# Patient Record
Sex: Female | Born: 1960 | Hispanic: No | Marital: Married | Smoking: Never smoker
Health system: Southern US, Community
[De-identification: ages and names within clinical notes are randomized; demographics above are authoritative.]

## PROBLEM LIST (undated history)

## (undated) DIAGNOSIS — I10 Essential (primary) hypertension: Secondary | ICD-10-CM

## (undated) DIAGNOSIS — N926 Irregular menstruation, unspecified: Secondary | ICD-10-CM

## (undated) DIAGNOSIS — I499 Cardiac arrhythmia, unspecified: Secondary | ICD-10-CM

## (undated) DIAGNOSIS — M25559 Pain in unspecified hip: Secondary | ICD-10-CM

## (undated) DIAGNOSIS — A630 Anogenital (venereal) warts: Secondary | ICD-10-CM

## (undated) DIAGNOSIS — K219 Gastro-esophageal reflux disease without esophagitis: Secondary | ICD-10-CM

## (undated) DIAGNOSIS — Z8742 Personal history of other diseases of the female genital tract: Secondary | ICD-10-CM

## (undated) HISTORY — PX: APPENDECTOMY: SHX54

## (undated) HISTORY — PX: TONSILLECTOMY: SUR1361

## (undated) HISTORY — DX: Personal history of other diseases of the female genital tract: Z87.42

## (undated) HISTORY — DX: Anogenital (venereal) warts: A63.0

## (undated) HISTORY — DX: Pain in unspecified hip: M25.559

## (undated) HISTORY — DX: Irregular menstruation, unspecified: N92.6

## (undated) HISTORY — DX: Essential (primary) hypertension: I10

---

## 2002-09-11 ENCOUNTER — Encounter: Admission: RE | Admit: 2002-09-11 | Discharge: 2002-09-11 | Payer: Self-pay | Admitting: General Practice

## 2002-09-11 ENCOUNTER — Encounter: Payer: Self-pay | Admitting: General Practice

## 2002-09-19 ENCOUNTER — Encounter: Payer: Self-pay | Admitting: General Practice

## 2002-09-19 ENCOUNTER — Encounter: Admission: RE | Admit: 2002-09-19 | Discharge: 2002-09-19 | Payer: Self-pay | Admitting: General Practice

## 2003-07-05 DIAGNOSIS — Z8742 Personal history of other diseases of the female genital tract: Secondary | ICD-10-CM

## 2003-07-05 HISTORY — DX: Personal history of other diseases of the female genital tract: Z87.42

## 2004-02-17 ENCOUNTER — Other Ambulatory Visit: Admission: RE | Admit: 2004-02-17 | Discharge: 2004-02-17 | Payer: Self-pay | Admitting: Obstetrics and Gynecology

## 2004-07-04 DIAGNOSIS — N926 Irregular menstruation, unspecified: Secondary | ICD-10-CM

## 2004-07-04 HISTORY — DX: Irregular menstruation, unspecified: N92.6

## 2005-03-04 DIAGNOSIS — A630 Anogenital (venereal) warts: Secondary | ICD-10-CM

## 2005-03-04 HISTORY — DX: Anogenital (venereal) warts: A63.0

## 2005-03-11 ENCOUNTER — Other Ambulatory Visit: Admission: RE | Admit: 2005-03-11 | Discharge: 2005-03-11 | Payer: Self-pay | Admitting: Obstetrics and Gynecology

## 2006-07-04 DIAGNOSIS — M25559 Pain in unspecified hip: Secondary | ICD-10-CM

## 2006-07-04 HISTORY — DX: Pain in unspecified hip: M25.559

## 2008-07-04 DIAGNOSIS — I499 Cardiac arrhythmia, unspecified: Secondary | ICD-10-CM

## 2008-07-04 HISTORY — PX: OTHER SURGICAL HISTORY: SHX169

## 2008-07-04 HISTORY — DX: Cardiac arrhythmia, unspecified: I49.9

## 2008-12-06 ENCOUNTER — Emergency Department (HOSPITAL_COMMUNITY): Admission: EM | Admit: 2008-12-06 | Discharge: 2008-12-06 | Payer: Self-pay | Admitting: Emergency Medicine

## 2010-05-21 ENCOUNTER — Ambulatory Visit: Payer: Self-pay | Admitting: Diagnostic Radiology

## 2010-05-21 ENCOUNTER — Ambulatory Visit (HOSPITAL_BASED_OUTPATIENT_CLINIC_OR_DEPARTMENT_OTHER): Admission: RE | Admit: 2010-05-21 | Discharge: 2010-05-21 | Payer: Self-pay | Admitting: Internal Medicine

## 2010-06-15 ENCOUNTER — Encounter
Admission: RE | Admit: 2010-06-15 | Discharge: 2010-06-15 | Payer: Self-pay | Source: Home / Self Care | Attending: Gastroenterology | Admitting: Gastroenterology

## 2010-07-04 HISTORY — PX: SHOULDER SURGERY: SHX246

## 2010-07-24 ENCOUNTER — Encounter: Payer: Self-pay | Admitting: Obstetrics and Gynecology

## 2010-08-06 ENCOUNTER — Other Ambulatory Visit: Payer: Self-pay | Admitting: Gastroenterology

## 2010-08-10 ENCOUNTER — Other Ambulatory Visit: Payer: Self-pay

## 2010-08-10 ENCOUNTER — Ambulatory Visit (HOSPITAL_COMMUNITY)
Admission: RE | Admit: 2010-08-10 | Discharge: 2010-08-10 | Disposition: A | Discharge status: Home / Self Care | Payer: Commercial Managed Care - PPO | Source: Ambulatory Visit | Attending: Gastroenterology | Admitting: Gastroenterology

## 2010-08-10 DIAGNOSIS — I10 Essential (primary) hypertension: Secondary | ICD-10-CM | POA: Insufficient documentation

## 2010-08-10 DIAGNOSIS — Z9861 Coronary angioplasty status: Secondary | ICD-10-CM | POA: Insufficient documentation

## 2010-08-10 DIAGNOSIS — R109 Unspecified abdominal pain: Secondary | ICD-10-CM | POA: Insufficient documentation

## 2010-08-10 DIAGNOSIS — K838 Other specified diseases of biliary tract: Secondary | ICD-10-CM | POA: Insufficient documentation

## 2010-08-10 DIAGNOSIS — R110 Nausea: Secondary | ICD-10-CM | POA: Insufficient documentation

## 2010-08-10 DIAGNOSIS — I2510 Atherosclerotic heart disease of native coronary artery without angina pectoris: Secondary | ICD-10-CM | POA: Insufficient documentation

## 2010-10-11 LAB — DIFFERENTIAL
Basophils Absolute: 0 10*3/uL (ref 0.0–0.1)
Basophils Relative: 0 % (ref 0–1)
Neutro Abs: 4.2 10*3/uL (ref 1.7–7.7)
Neutrophils Relative %: 59 % (ref 43–77)

## 2010-10-11 LAB — BASIC METABOLIC PANEL
CO2: 30 mEq/L (ref 19–32)
Calcium: 9.7 mg/dL (ref 8.4–10.5)
Creatinine, Ser: 0.55 mg/dL (ref 0.4–1.2)
Glucose, Bld: 107 mg/dL — ABNORMAL HIGH (ref 70–99)

## 2010-10-11 LAB — CBC
MCHC: 34.4 g/dL (ref 30.0–36.0)
RDW: 13.9 % (ref 11.5–15.5)

## 2010-10-11 LAB — PROTIME-INR: INR: 1.2 (ref 0.00–1.49)

## 2010-10-11 LAB — APTT: aPTT: 28 seconds (ref 24–37)

## 2011-11-16 ENCOUNTER — Telehealth: Payer: Self-pay | Admitting: Obstetrics and Gynecology

## 2011-12-06 ENCOUNTER — Ambulatory Visit (INDEPENDENT_AMBULATORY_CARE_PROVIDER_SITE_OTHER): Payer: Commercial Indemnity | Admitting: Obstetrics and Gynecology

## 2011-12-06 ENCOUNTER — Encounter: Payer: Self-pay | Admitting: Obstetrics and Gynecology

## 2011-12-06 VITALS — BP 128/80 | HR 76 | Ht 69.0 in | Wt 150.0 lb

## 2011-12-06 DIAGNOSIS — Z01419 Encounter for gynecological examination (general) (routine) without abnormal findings: Secondary | ICD-10-CM

## 2011-12-06 DIAGNOSIS — Z124 Encounter for screening for malignant neoplasm of cervix: Secondary | ICD-10-CM

## 2011-12-06 NOTE — Progress Notes (Signed)
Last Pap: 04/27/07 WNL: Yes Regular Periods:no Contraception: none  Monthly Breast exam:yes Tetanus<52yrs:no Nl.Bladder Function: yes  Daily BMs:no Healthy Diet:yes Calcium:no Mammogram:yes 10/2011 per pt Exercise:yes Seatbelt: yes Abuse at home: no Stressful work:yes Sigmoid-colonoscopy: 09/2010 per pt Bone Density: No Pt has questions about multivitamins for women. Pt also states she has difficulty with bowel movements.  Dawn Harrington, Charetha Pt without c/o BP 128/80  Pulse 76  Ht 5\' 9"  (1.753 m)  Wt 150 lb (68.04 kg)  BMI 22.15 kg/m2 Physical Examination: Neck - supple, no significant adenopathy Chest - clear to auscultation, no wheezes, rales or rhonchi, symmetric air entry Heart - normal rate, regular rhythm, normal S1, S2, no murmurs, rubs, clicks or gallops Abdomen - soft, nontender, nondistended, no masses or organomegaly Breasts - breasts appear normal, no suspicious masses, no skin or nipple changes or axillary nodes Pelvic - normal external genitalia, vulva, vagina, cervix, uterus and adnexa, atrophic Rectal - normal rectal, no masses Musculoskeletal - no joint tenderness, deformity or swelling Extremities - peripheral pulses normal, no pedal edema, no clubbing or cyanosis Skin - normal coloration and turgor, no rashes, no suspicious skin lesions noted Normal AEX Pt due for mammogram no colonoscopy due no Pap done yes RT one month Diet and exercise discussed Pt with h/o endometrial polyp no further bleeding.  SHG/US poss EMB at next visit

## 2011-12-08 LAB — PAP IG W/ RFLX HPV ASCU

## 2012-01-09 ENCOUNTER — Other Ambulatory Visit: Payer: Self-pay | Admitting: Obstetrics and Gynecology

## 2012-01-09 ENCOUNTER — Ambulatory Visit (INDEPENDENT_AMBULATORY_CARE_PROVIDER_SITE_OTHER): Payer: Commercial Indemnity

## 2012-01-09 ENCOUNTER — Encounter: Payer: Self-pay | Admitting: Obstetrics and Gynecology

## 2012-01-09 ENCOUNTER — Ambulatory Visit (INDEPENDENT_AMBULATORY_CARE_PROVIDER_SITE_OTHER): Payer: Commercial Indemnity | Admitting: Obstetrics and Gynecology

## 2012-01-09 VITALS — BP 102/60 | Temp 98.5°F | Resp 16 | Ht 69.0 in | Wt 151.0 lb

## 2012-01-09 DIAGNOSIS — N840 Polyp of corpus uteri: Secondary | ICD-10-CM

## 2012-01-09 DIAGNOSIS — Z124 Encounter for screening for malignant neoplasm of cervix: Secondary | ICD-10-CM

## 2012-01-09 DIAGNOSIS — Z3201 Encounter for pregnancy test, result positive: Secondary | ICD-10-CM

## 2012-01-09 NOTE — Progress Notes (Signed)
Sonohysterography with endometrial bx today .ccoSHG/EMBX:  The patient was consented for both procedures.  She was placed in dorsal lithotomy position and speculum placed in the vagina.  The cervix was cleansed with three betadine swabs.  The endometrial pipet was placed in the the endometrial cavity through the cervix.  The uterus did sound to 7cm.  The pipet was removed and specimen was sent to pathology.  The sonohysterography catheter was then placed through the cervix and vaginal probe placed back in the vagina.   [place] 5.48 by 3.21 cm.  Normal adnexa and ovaries.  On SHG there is a .35 cm polyp Plan hysteroscopy and polypectomy

## 2012-01-16 ENCOUNTER — Telehealth: Payer: Self-pay | Admitting: Obstetrics and Gynecology

## 2012-01-19 ENCOUNTER — Telehealth: Payer: Self-pay

## 2012-01-19 ENCOUNTER — Telehealth: Payer: Self-pay | Admitting: Obstetrics and Gynecology

## 2012-01-19 NOTE — Telephone Encounter (Signed)
D&C Hysteroscopy; Polypectomy with TruClear.  [employer] effective 04/04/11. Plan pays 80/20 after a $550 deductible. Pre-op due $80.  -Adrianne Pridgen

## 2012-01-19 NOTE — Telephone Encounter (Signed)
Lm on vm tcb rgd labs 

## 2012-01-19 NOTE — Telephone Encounter (Signed)
Message copied by Rolla Plate on Thu Jan 19, 2012  4:25 PM ------      Message from: Jaymes Graff      Created: Thu Jan 19, 2012  3:48 PM       Please call the patient and let her know her endometrial biopsy is normal

## 2012-01-23 ENCOUNTER — Telehealth: Payer: Self-pay | Admitting: Obstetrics and Gynecology

## 2012-01-23 NOTE — Telephone Encounter (Signed)
D&C Hysteroscopy; Polypectomy with TruClear scheduled for 02/07/12 @ 10:45 with ND.  [employer] effective 04/04/11. Plan pays 80/20 after a $550 deductible. Pre-op due $80.  -Adrianne Pridgen

## 2012-01-23 NOTE — Telephone Encounter (Signed)
Spoke with pt informed endo biopsy wnl pt voice understanding 

## 2012-01-23 NOTE — Telephone Encounter (Signed)
Lm with man for pt to call office

## 2012-01-31 ENCOUNTER — Encounter: Payer: Self-pay | Admitting: Obstetrics and Gynecology

## 2012-01-31 ENCOUNTER — Ambulatory Visit (INDEPENDENT_AMBULATORY_CARE_PROVIDER_SITE_OTHER): Payer: Commercial Indemnity | Admitting: Obstetrics and Gynecology

## 2012-01-31 VITALS — BP 112/78 | HR 80 | Temp 98.3°F | Ht 67.5 in | Wt 151.0 lb

## 2012-01-31 DIAGNOSIS — Z01818 Encounter for other preprocedural examination: Secondary | ICD-10-CM

## 2012-01-31 NOTE — Progress Notes (Signed)
Dawn Harrington is a 51 y.o. female [id number] who presents for hysteroscopy with dilatation, curettage and polypectomy because of an endometrial polyp.  In 2008 patient was found to have an endometrial polyp, during an evaluation for menorrhagia.  She returned to [facility] in June of this year without having followed up on that  polyp.  An endometrial biopsy performed at that time  showed benign findings without endometritis, hyperplasia or malignancy, it was further reported that atrophy was favored.  A pelvic ultrasound with sono-hysterogram revealed uterus 5.48 x 3.21 x 3.0 cm, normal appearing ovaries and an endometrial polyp measuring 0.35 x 0.34 cm.  Patient reports approximately 2-3 menstrual period each year, that are virtually painless.  Her flow  will last 3 days and only require a pad change 4 times a day.  Given the findings on her  recent ultrasound and previous history, the patient has decided to proceed with hysteroscopic polypectomy with dilatation and curettage.  [The use of [employer]' phone line was provided for this patient's preoperative visit however she declined  its use and wishes to utilize her adult daughter-[name], who has accompanied her.]  Past Medical History  OB History: G7P0016 SVD  GYN History: menarche 51 YO;  LMP -see HPI;   Contracepton-no method; History of  genital wart.;  Denies history of abnormal PAP smears;  Last PAP smear June 2013- normal  Medical History: hypertension,  genital warts, cardiac catheterization years ago (per patient, was told she had a small blockage but no heart damage)  Surgical History:    2012-Shoulder surgery and 1980 Tonsillectomy  Denies problems with anesthesia or history of blood transfusions  Family History: negative  Social History:  Married, 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   Outpatient Encounter Prescriptions as of 01/31/2012  Medication Sig Dispense Refill  . aspirin 81 MG tablet Take 81 mg by mouth daily.      . Cholecalciferol (VITAMIN D PO) Take by mouth.      . UNKNOWN TO PATIENT PT TAKE A BP MED; DO NOT KNOW NAME      . cyclobenzaprine (FLEXERIL) 5 MG tablet Take 5 mg by mouth 3 (three) times daily as needed.      . gabapentin (NEURONTIN) 100 MG capsule Take 100 mg by mouth 3 (three) times daily.        No Known Allergies Denies sensitivity to peanuts, shellfish, soy, latex, or  adhesives  ROS: Admits to glasses, partial dental plate in lower jaw;   Denies headache, vision changes, dysphagia, tinnitus, dizziness,  chest pain, shortness of breath, nausea, vomiting, diarrhea, dysuria, hematuria, pelvic pain, swelling of joints,easy bruising,  myalgias, arthralgias, skin rashes and except as is mentioned in the history of present illness, patient's review of systems is otherwise negative   Physical Exam    BP 112/78  Pulse 80  Temp 98.3 F (36.8 C) (Oral)  Ht 5' 7.5" (1.715 m)  Wt 151 lb (68.493 kg)  BMI 23.30 kg/m2  Neck: supple without masses or thyromegaly Lungs: clear to auscultation Heart: regular rate and rhythm Abdomen: soft, non-tender and no organomegaly Pelvic:EGBUS- wnl except female circumcision; vagina-atrophic; uterus-normal size without tenderdess, cervix without lesions or motion tenderness; adnexae-no tenderness or   masses Extremities:  no clubbing, cyanosis or edema   Assesment:  Endometrial Polyp                        Partial Language Barrier   Disposition:  A discussion was held with patient regarding the indication for her procedure(s) along with the risks, which include but are not limited to: reaction to anesthesia, damage to adjacent organs, infection, scarring  and excessive bleeding.  Patient verbalized understanding of these risks, had her questions answered and has consented to proceed with Hysteroscopy,  Dilatation, Curettage, and  Polypectomy  with Tru Clear at [facility]  02/07/12 @ 10:45 a.m.   CSN# [id number]   Kataryna Mcquilkin J. Emilea Goga, PA-C  for Dr. Naima Dillard   

## 2012-01-31 NOTE — H&P (Signed)
Dawn Harrington is a 51 y.o. female [id number] who presents for hysteroscopy with dilatation, curettage and polypectomy because of an endometrial polyp.  In 2008 patient was found to have an endometrial polyp, during an evaluation for menorrhagia.  She returned to [facility] in June of this year without having followed up on that  polyp.  An endometrial biopsy performed at that time  showed benign findings without endometritis, hyperplasia or malignancy, it was further reported that atrophy was favored.  A pelvic ultrasound with sono-hysterogram revealed uterus 5.48 x 3.21 x 3.0 cm, normal appearing ovaries and an endometrial polyp measuring 0.35 x 0.34 cm.  Patient reports approximately 2-3 menstrual period each year, that are virtually painless.  Her flow  will last 3 days and only require a pad change 4 times a day.  Given the findings on her  recent ultrasound and previous history, the patient has decided to proceed with hysteroscopic polypectomy with dilatation and curettage.  [The use of [employer]' phone line was provided for this patient's preoperative visit however she declined  its use and wishes to utilize her adult daughter-Dawn Harrington, who has accompanied her.]  Past Medical History  OB History: G7P0016 SVD  GYN History: menarche 51 YO;  LMP -see HPI;   Contracepton-no method; History of  genital wart.;  Denies history of abnormal PAP smears;  Last PAP smear June 2013- normal  Medical History: hypertension,  genital warts, cardiac catheterization years ago (per patient, was told she had a small blockage but no heart damage)  Surgical History:    2012-Shoulder surgery and 1980 Tonsillectomy  Denies problems with anesthesia or history of blood transfusions  Family History: negative  Social History:  Married, employed in housekeeping at [employer];  [place] Arabic, [place] and  limited [place];  Denies alcohol,                            tobacco or illicit  drug use.   Outpatient Encounter Prescriptions as of 01/31/2012  Medication Sig Dispense Refill  . aspirin 81 MG tablet Take 81 mg by mouth daily.      . Cholecalciferol (VITAMIN D PO) Take by mouth.      Marland Kitchen UNKNOWN TO PATIENT PT TAKE A BP MED; DO NOT KNOW NAME      . cyclobenzaprine (FLEXERIL) 5 MG tablet Take 5 mg by mouth 3 (three) times daily as needed.      . gabapentin (NEURONTIN) 100 MG capsule Take 100 mg by mouth 3 (three) times daily.        No Known Allergies Denies sensitivity to peanuts, shellfish, soy, latex, or  adhesives  ROS: Admits to glasses, partial dental plate in lower jaw;   Denies headache, vision changes, dysphagia, tinnitus, dizziness,  chest pain, shortness of breath, nausea, vomiting, diarrhea, dysuria, hematuria, pelvic pain, swelling of joints,easy bruising,  myalgias, arthralgias, skin rashes and except as is mentioned in the history of present illness, patient's review of systems is otherwise negative   Physical Exam    BP 112/78  Pulse 80  Temp 98.3 F (36.8 C) (Oral)  Ht 5' 7.5" (1.715 m)  Wt 151 lb (68.493 kg)  BMI 23.30 kg/m2  Neck: supple without masses or thyromegaly Lungs: clear to auscultation Heart: regular rate and rhythm Abdomen: soft, non-tender and no organomegaly Pelvic:EGBUS- wnl except female circumcision; vagina-atrophic; uterus-normal size without tenderdess, cervix without lesions or motion tenderness; adnexae-no tenderness or  masses Extremities:  no clubbing, cyanosis or edema   Assesment:  Endometrial Polyp                        Partial Language Barrier   Disposition:  A discussion was held with patient regarding the indication for her procedure(s) along with the risks, which include but are not limited to: reaction to anesthesia, damage to adjacent organs, infection, scarring  and excessive bleeding.  Patient verbalized understanding of these risks, had her questions answered and has consented to proceed with Hysteroscopy,  Dilatation, Curettage, and  Polypectomy  with Tru Clear at [facility] 02/07/12 @ 10:45 a.m.   CSN# [id number]   Rylea Selway J. Lowell Guitar, PA-C  for Dr. Jaymes Graff

## 2012-02-01 ENCOUNTER — Encounter (HOSPITAL_COMMUNITY): Payer: Self-pay | Admitting: Pharmacist

## 2012-02-01 NOTE — Evaluation (Signed)
Patient speaks limited English (African), impression was she had heart surgery in [place] at some time, unable to understand name of cardiologist in [facility] and son does not know the name. Will request info from [facility]

## 2012-02-02 ENCOUNTER — Encounter (HOSPITAL_COMMUNITY)
Admission: RE | Admit: 2012-02-02 | Discharge: 2012-02-02 | Disposition: A | Discharge status: Home / Self Care | Payer: Managed Care, Other (non HMO) | Source: Ambulatory Visit | Attending: Obstetrics and Gynecology | Admitting: Obstetrics and Gynecology

## 2012-02-02 ENCOUNTER — Encounter (HOSPITAL_COMMUNITY): Payer: Self-pay

## 2012-02-02 HISTORY — DX: Cardiac arrhythmia, unspecified: I49.9

## 2012-02-02 HISTORY — DX: Gastro-esophageal reflux disease without esophagitis: K21.9

## 2012-02-02 LAB — CBC
MCV: 90.8 fL (ref 78.0–100.0)
Platelets: 250 10*3/uL (ref 150–400)
RBC: 4.37 MIL/uL (ref 3.87–5.11)
WBC: 5.3 10*3/uL (ref 4.0–10.5)

## 2012-02-02 NOTE — Patient Instructions (Addendum)
   Your procedure is scheduled on: Tuesday August 6th  Enter through the Main Entrance of [facility] at: 9:15am Pick up the phone at the desk and dial [phone] and inform us of your arrival.  Please call this number if you have any problems the morning of surgery: [phone]  Remember: Do not eat food after midnight: Monday Do not drink clear liquids after: midnight Monday Take these medicines the morning of surgery with a SIP OF WATER: Norvasc  Do not wear jewelry, make-up, or FINGER nail polish No metal in your hair or on your body. Do not wear lotions, powders, perfumes or deodorant. Do not shave 48 hours prior to surgery. Do not bring valuables to the hospital. Contacts, dentures or bridgework may not be worn into surgery.    Patients discharged on the day of surgery will not be allowed to drive home.     Remember to use your hibiclens as instructed.Please shower with 1/2 bottle the evening before your surgery and the other 1/2 bottle the morning of surgery. Neck down avoiding private area.

## 2012-02-02 NOTE — Pre-Procedure Instructions (Addendum)
Interpreter here Dawn Harrington from Dawn Harrington Requested EKG from Dr Sullivan Lone office [facility] in [place]- Pt states had stress test at [facility] in 2011 or 2012-requested. Reviewed history with Dr Randall An spray on call to OR Call back received from [facility] Records-no records for stress test.

## 2012-02-06 MED ORDER — NITROGLYCERIN 0.4 MG/SPRAY TL SOLN
1.0000 | Status: DC | PRN
  Filled 2012-02-06: qty 4.9

## 2012-02-07 ENCOUNTER — Ambulatory Visit (HOSPITAL_COMMUNITY): Payer: Managed Care, Other (non HMO) | Admitting: Anesthesiology

## 2012-02-07 ENCOUNTER — Encounter (HOSPITAL_COMMUNITY): Payer: Self-pay | Admitting: Anesthesiology

## 2012-02-07 ENCOUNTER — Encounter (HOSPITAL_COMMUNITY)
Admission: RE | Disposition: A | Discharge status: Home / Self Care | Payer: Self-pay | Source: Ambulatory Visit | Attending: Obstetrics and Gynecology

## 2012-02-07 ENCOUNTER — Ambulatory Visit (HOSPITAL_COMMUNITY)
Admission: RE | Admit: 2012-02-07 | Discharge: 2012-02-07 | Disposition: A | Discharge status: Home / Self Care | Payer: Managed Care, Other (non HMO) | Source: Ambulatory Visit | Attending: Obstetrics and Gynecology | Admitting: Obstetrics and Gynecology

## 2012-02-07 DIAGNOSIS — Z9889 Other specified postprocedural states: Secondary | ICD-10-CM

## 2012-02-07 DIAGNOSIS — N938 Other specified abnormal uterine and vaginal bleeding: Secondary | ICD-10-CM | POA: Insufficient documentation

## 2012-02-07 DIAGNOSIS — Z01812 Encounter for preprocedural laboratory examination: Secondary | ICD-10-CM | POA: Insufficient documentation

## 2012-02-07 DIAGNOSIS — N949 Unspecified condition associated with female genital organs and menstrual cycle: Secondary | ICD-10-CM | POA: Insufficient documentation

## 2012-02-07 DIAGNOSIS — N950 Postmenopausal bleeding: Secondary | ICD-10-CM

## 2012-02-07 DIAGNOSIS — Z01818 Encounter for other preprocedural examination: Secondary | ICD-10-CM | POA: Insufficient documentation

## 2012-02-07 SURGERY — DILATATION & CURETTAGE/HYSTEROSCOPY WITH RESECTOCOPE
Anesthesia: General | Site: Vagina | Wound class: Clean Contaminated

## 2012-02-07 MED ORDER — DEXAMETHASONE SODIUM PHOSPHATE 4 MG/ML IJ SOLN
INTRAMUSCULAR | Status: DC | PRN
  Administered 2012-02-07 (×2): 5 mg via INTRAVENOUS

## 2012-02-07 MED ORDER — EPHEDRINE SULFATE 50 MG/ML IJ SOLN
INTRAMUSCULAR | Status: DC | PRN
  Administered 2012-02-07 (×3): 10 mg via INTRAVENOUS

## 2012-02-07 MED ORDER — DEXAMETHASONE SODIUM PHOSPHATE 10 MG/ML IJ SOLN
INTRAMUSCULAR | Status: AC
  Filled 2012-02-07: qty 1

## 2012-02-07 MED ORDER — KETOROLAC TROMETHAMINE 30 MG/ML IJ SOLN: 15.0000 mg | Freq: Once | INTRAMUSCULAR | Status: DC | PRN

## 2012-02-07 MED ORDER — SODIUM CHLORIDE 0.9 % IR SOLN
Status: DC | PRN
  Administered 2012-02-07: 3000 mL

## 2012-02-07 MED ORDER — SILVER NITRATE-POT NITRATE 75-25 % EX MISC
CUTANEOUS | Status: AC
  Filled 2012-02-07: qty 1

## 2012-02-07 MED ORDER — PROMETHAZINE HCL 25 MG RE SUPP
RECTAL | Status: AC
  Filled 2012-02-07: qty 1

## 2012-02-07 MED ORDER — IBUPROFEN 600 MG PO TABS: 600.0000 mg | ORAL_TABLET | Freq: Four times a day (QID) | ORAL | Status: AC | PRN

## 2012-02-07 MED ORDER — LIDOCAINE HCL (CARDIAC) 20 MG/ML IV SOLN
INTRAVENOUS | Status: AC
  Filled 2012-02-07: qty 5

## 2012-02-07 MED ORDER — MIDAZOLAM HCL 5 MG/5ML IJ SOLN
INTRAMUSCULAR | Status: DC | PRN
  Administered 2012-02-07 (×2): 1 mg via INTRAVENOUS

## 2012-02-07 MED ORDER — FENTANYL CITRATE 0.05 MG/ML IJ SOLN: 25.0000 ug | INTRAMUSCULAR | Status: DC | PRN

## 2012-02-07 MED ORDER — EPHEDRINE 5 MG/ML INJ
INTRAVENOUS | Status: AC
  Filled 2012-02-07: qty 10

## 2012-02-07 MED ORDER — LIDOCAINE HCL (CARDIAC) 20 MG/ML IV SOLN
INTRAVENOUS | Status: DC | PRN
  Administered 2012-02-07: 60 mg via INTRAVENOUS

## 2012-02-07 MED ORDER — PROMETHAZINE HCL 25 MG RE SUPP: 25.0000 mg | RECTAL | Status: DC | PRN

## 2012-02-07 MED ORDER — ONDANSETRON HCL 4 MG/2ML IJ SOLN
INTRAMUSCULAR | Status: AC
  Filled 2012-02-07: qty 2

## 2012-02-07 MED ORDER — KETOROLAC TROMETHAMINE 30 MG/ML IJ SOLN
INTRAMUSCULAR | Status: AC
  Filled 2012-02-07: qty 1

## 2012-02-07 MED ORDER — ONDANSETRON HCL 4 MG/2ML IJ SOLN
INTRAMUSCULAR | Status: DC | PRN
  Administered 2012-02-07: 4 mg via INTRAVENOUS

## 2012-02-07 MED ORDER — FENTANYL CITRATE 0.05 MG/ML IJ SOLN
INTRAMUSCULAR | Status: DC | PRN
  Administered 2012-02-07: 12.5 ug via INTRAVENOUS
  Administered 2012-02-07: 50 ug via INTRAVENOUS
  Administered 2012-02-07: 12.5 ug via INTRAVENOUS

## 2012-02-07 MED ORDER — MIDAZOLAM HCL 2 MG/2ML IJ SOLN
INTRAMUSCULAR | Status: AC
  Filled 2012-02-07: qty 2

## 2012-02-07 MED ORDER — LIDOCAINE HCL 2 % IJ SOLN
INTRAMUSCULAR | Status: DC | PRN
  Administered 2012-02-07: 20 mL

## 2012-02-07 MED ORDER — LIDOCAINE HCL 2 % IJ SOLN
INTRAMUSCULAR | Status: AC
  Filled 2012-02-07: qty 1

## 2012-02-07 MED ORDER — KETOROLAC TROMETHAMINE 30 MG/ML IJ SOLN
INTRAMUSCULAR | Status: DC | PRN
  Administered 2012-02-07: 30 mg via INTRAVENOUS

## 2012-02-07 MED ORDER — ONDANSETRON HCL 4 MG/2ML IJ SOLN: 4.0000 mg | Freq: Once | INTRAMUSCULAR | Status: DC | PRN

## 2012-02-07 MED ORDER — MEPERIDINE HCL 25 MG/ML IJ SOLN: 6.2500 mg | INTRAMUSCULAR | Status: DC | PRN

## 2012-02-07 MED ORDER — PROPOFOL 10 MG/ML IV EMUL
INTRAVENOUS | Status: AC
  Filled 2012-02-07: qty 20

## 2012-02-07 MED ORDER — FENTANYL CITRATE 0.05 MG/ML IJ SOLN
INTRAMUSCULAR | Status: AC
  Filled 2012-02-07: qty 2

## 2012-02-07 MED ORDER — LACTATED RINGERS IV SOLN
INTRAVENOUS | Status: DC
  Administered 2012-02-07 (×2): via INTRAVENOUS

## 2012-02-07 MED ORDER — SILVER NITRATE-POT NITRATE 75-25 % EX MISC
CUTANEOUS | Status: DC | PRN
  Administered 2012-02-07: 1

## 2012-02-07 MED ORDER — PROPOFOL 10 MG/ML IV EMUL
INTRAVENOUS | Status: DC | PRN
  Administered 2012-02-07: 150 mg via INTRAVENOUS

## 2012-02-07 SURGICAL SUPPLY — 15 items
ABLATOR ENDOMETRIAL BIPOLAR (ABLATOR) IMPLANT
CANISTER SUCTION 2500CC (MISCELLANEOUS) ×1 IMPLANT
CATH ROBINSON RED A/P 16FR (CATHETERS) ×2 IMPLANT
CLOTH BEACON ORANGE TIMEOUT ST (SAFETY) ×2 IMPLANT
CONTAINER PREFILL 10% NBF 60ML (FORM) ×2 IMPLANT
ELECT REM PT RETURN 9FT ADLT (ELECTROSURGICAL) ×2
ELECTRODE REM PT RTRN 9FT ADLT (ELECTROSURGICAL) ×1 IMPLANT
GLOVE BIO SURGEON STRL SZ 6.5 (GLOVE) ×2 IMPLANT
GLOVE BIOGEL PI IND STRL 7.0 (GLOVE) ×1 IMPLANT
GLOVE BIOGEL PI INDICATOR 7.0 (GLOVE) ×1
GOWN STRL REIN XL XLG (GOWN DISPOSABLE) ×4 IMPLANT
PACK HYSTEROSCOPY LF (CUSTOM PROCEDURE TRAY) ×2 IMPLANT
SYR 20CC LL (SYRINGE) ×2 IMPLANT
TOWEL OR 17X24 6PK STRL BLUE (TOWEL DISPOSABLE) ×4 IMPLANT
WATER STERILE IRR 1000ML POUR (IV SOLUTION) ×2 IMPLANT

## 2012-02-07 NOTE — Anesthesia Postprocedure Evaluation (Signed)
Anesthesia Post Note  Patient: Dawn Harrington  Procedure(s) Performed: Procedure(s) (LRB): DILATATION & CURETTAGE/HYSTEROSCOPY WITH RESECTOCOPE (N/A)  Anesthesia type: General  Patient location: PACU  Post pain: Pain level controlled  Post assessment: Post-op Vital signs reviewed  Last Vitals:  Filed Vitals:   02/07/12 1130  BP: 104/62  Pulse: 85  Temp: 36.5 C  Resp: 12    Post vital signs: Reviewed  Level of consciousness: sedated  Complications: No apparent anesthesia complicationsfj

## 2012-02-07 NOTE — H&P (View-Only) (Signed)
Dawn Harrington is a 51 y.o. female [id number] who presents for hysteroscopy with dilatation, curettage and polypectomy because of an endometrial polyp.  In 2008 patient was found to have an endometrial polyp, during an evaluation for menorrhagia.  She returned to [facility] in June of this year without having followed up on that  polyp.  An endometrial biopsy performed at that time  showed benign findings without endometritis, hyperplasia or malignancy, it was further reported that atrophy was favored.  A pelvic ultrasound with sono-hysterogram revealed uterus 5.48 x 3.21 x 3.0 cm, normal appearing ovaries and an endometrial polyp measuring 0.35 x 0.34 cm.  Patient reports approximately 2-3 menstrual period each year, that are virtually painless.  Her flow  will last 3 days and only require a pad change 4 times a day.  Given the findings on her  recent ultrasound and previous history, the patient has decided to proceed with hysteroscopic polypectomy with dilatation and curettage.  [The use of [employer]' phone line was provided for this patient's preoperative visit however she declined  its use and wishes to utilize her adult daughter-Dawn Harrington, who has accompanied her.]  Past Medical History  OB History: G7P0016 SVD  GYN History: menarche 51 YO;  LMP -see HPI;   Contracepton-no method; History of  genital wart.;  Denies history of abnormal PAP smears;  Last PAP smear June 2013- normal  Medical History: hypertension,  genital warts, cardiac catheterization years ago (per patient, was told she had a small blockage but no heart damage)  Surgical History:    2012-Shoulder surgery and 1980 Tonsillectomy  Denies problems with anesthesia or history of blood transfusions  Family History: negative  Social History:  Married, 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   Outpatient Encounter Prescriptions as of 01/31/2012  Medication Sig Dispense Refill  . aspirin 81 MG tablet Take 81 mg by mouth daily.      . Cholecalciferol (VITAMIN D PO) Take by mouth.      . UNKNOWN TO PATIENT PT TAKE A BP MED; DO NOT KNOW NAME      . cyclobenzaprine (FLEXERIL) 5 MG tablet Take 5 mg by mouth 3 (three) times daily as needed.      . gabapentin (NEURONTIN) 100 MG capsule Take 100 mg by mouth 3 (three) times daily.        No Known Allergies Denies sensitivity to peanuts, shellfish, soy, latex, or  adhesives  ROS: Admits to glasses, partial dental plate in lower jaw;   Denies headache, vision changes, dysphagia, tinnitus, dizziness,  chest pain, shortness of breath, nausea, vomiting, diarrhea, dysuria, hematuria, pelvic pain, swelling of joints,easy bruising,  myalgias, arthralgias, skin rashes and except as is mentioned in the history of present illness, patient's review of systems is otherwise negative   Physical Exam    BP 112/78  Pulse 80  Temp 98.3 F (36.8 C) (Oral)  Ht 5' 7.5" (1.715 m)  Wt 151 lb (68.493 kg)  BMI 23.30 kg/m2  Neck: supple without masses or thyromegaly Lungs: clear to auscultation Heart: regular rate and rhythm Abdomen: soft, non-tender and no organomegaly Pelvic:EGBUS- wnl except female circumcision; vagina-atrophic; uterus-normal size without tenderdess, cervix without lesions or motion tenderness; adnexae-no tenderness or   masses Extremities:  no clubbing, cyanosis or edema   Assesment:  Endometrial Polyp                        Partial Language Barrier   Disposition:  A discussion was held with patient regarding the indication for her procedure(s) along with the risks, which include but are not limited to: reaction to anesthesia, damage to adjacent organs, infection, scarring  and excessive bleeding.  Patient verbalized understanding of these risks, had her questions answered and has consented to proceed with Hysteroscopy,  Dilatation, Curettage, and  Polypectomy  with Tru Clear at [facility] 02/07/12 @ 10:45 a.m.   CSN# [id number]   Dawn Habeeb J. Jr Milliron, PA-C  for Dr. Naima Harrington   

## 2012-02-07 NOTE — Anesthesia Preprocedure Evaluation (Signed)
Anesthesia Evaluation  Patient identified by MRN, date of birth, ID band Patient awake    Reviewed: Allergy & Precautions, H&P , NPO status , Patient's Chart, lab work & pertinent test results  Airway Mallampati: I TM Distance: >3 FB Neck ROM: full    Dental No notable dental hx. (+) Teeth Intact   Pulmonary neg pulmonary ROS,    Pulmonary exam normal       Cardiovascular hypertension, Pt. on medications     Neuro/Psych negative neurological ROS  negative psych ROS   GI/Hepatic Neg liver ROS,   Endo/Other  negative endocrine ROS  Renal/GU negative Renal ROS  negative genitourinary   Musculoskeletal negative musculoskeletal ROS (+)   Abdominal Normal abdominal exam  (+)   Peds negative pediatric ROS (+)  Hematology negative hematology ROS (+)   Anesthesia Other Findings   Reproductive/Obstetrics negative OB ROS                           Anesthesia Physical Anesthesia Plan  ASA: II  Anesthesia Plan: General   Post-op Pain Management:    Induction: Intravenous  Airway Management Planned: LMA  Additional Equipment:   Intra-op Plan:   Post-operative Plan:   Informed Consent: I have reviewed the patients History and Physical, chart, labs and discussed the procedure including the risks, benefits and alternatives for the proposed anesthesia with the patient or authorized representative who has indicated his/her understanding and acceptance.     Plan Discussed with: CRNA and Surgeon  Anesthesia Plan Comments:         Anesthesia Quick Evaluation

## 2012-02-07 NOTE — Op Note (Signed)
Pre op DX: Endometrial Polyp and PMVB   Post Op QM:VHQI   PHYSICIAN : Magaby Rumberger   ASSISTANTS: none   ANESTHESIA:   MAC and paracervical block  ESTIMATED BLOOD LOSS: minimal  LOCAL MEDICATIONS USED:  LIDOCAINE 20CC  SPECIMEN:  Source of Specimen:  endometrial curettings  DISPOSITION OF SPECIMEN:  PATHOLOGY  COUNTS Correct:  YES    DICTATION #: The patient was taken to the operating room and prepped and draped in a normal sterile fashion. An in out catheter was used to drain the bladder.   A bivalve speculum was placed into the vagina and anterior lip of the cervix was grasped with a single-tooth tenaculum.  20 cc of 2% lidocaine was used for cervical block.  the cervix was then dilated with Shawnie Pons dilators up to 17. The hysteroscope was placed into the uterine cavity. The  entire uterus and both ostia were visualized.   Hyseroscope was then removed from the uterus. A sharp curettage was then done with a curette and endometrial curettings were obtained. The endometrial curettings were sent to pathology. . Both ostia were  visualized there were no polyps or submucosal fibroids or endometrial masses seen. The tenaculum was removed from the cervix and hemostasis was noted.   PLAN OF CARE: discharge to home  PATIENT DISPOSITION:  PACU - hemodynamically stable.

## 2012-02-07 NOTE — Interval H&P Note (Signed)
History and Physical Interval Note:  02/07/2012 10:31 AM  Dawn Harrington  has presented today for surgery, with the diagnosis of Endometrial Polyp  The various methods of treatment have been discussed with the patient and family. After consideration of risks, benefits and other options for treatment, the patient has consented to  Procedure(s) (LRB): DILATATION & CURETTAGE/HYSTEROSCOPY WITH RESECTOCOPE (N/A) as a surgical intervention .  The patient's history has been reviewed, patient examined, no change in status, stable for surgery.  I have reviewed the patient's chart and labs.  Questions were answered to the patient's satisfaction.     [facility]

## 2012-02-07 NOTE — H&P (View-Only) (Signed)
Dawn Harrington is a 51 y.o. female G7P0016 who presents for hysteroscopy with dilatation, curettage and polypectomy because of an endometrial polyp.  In 2008 patient was found to have an endometrial polyp, during an evaluation for menorrhagia.  She returned to [facility] in June of this year without having followed up on that  polyp.  An endometrial biopsy performed at that time  showed benign findings without endometritis, hyperplasia or malignancy, it was further reported that atrophy was favored.  A pelvic ultrasound with sono-hysterogram revealed uterus 5.48 x 3.21 x 3.0 cm, normal appearing ovaries and an endometrial polyp measuring 0.35 x 0.34 cm.  Patient reports approximately 2-3 menstrual period each year, that are virtually painless.  Her flow  will last 3 days and only require a pad change 4 times a day.  Given the findings on her  recent ultrasound and previous history, the patient has decided to proceed with hysteroscopic polypectomy with dilatation and curettage.  [The use of [employer]' phone line was provided for this patient's preoperative visit however she declined  its use and wishes to utilize her adult daughter-[name], who has accompanied her.]  Past Medical History  OB History: G7P0016 SVD  GYN History: menarche 51 YO;  LMP -see HPI;   Contracepton-no method; History of  genital wart.;  Denies history of abnormal PAP smears;  Last PAP smear June 2013- normal  Medical History: hypertension,  genital warts, cardiac catheterization years ago (per patient, was told she had a small blockage but no heart damage)  Surgical History:    2012-Shoulder surgery and 1980 Tonsillectomy  Denies problems with anesthesia or history of blood transfusions  Family History: negative  Social History:  Married, 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   Outpatient Encounter Prescriptions as of 01/31/2012  Medication Sig Dispense Refill  . aspirin 81 MG tablet Take 81 mg by mouth daily.      . Cholecalciferol (VITAMIN D PO) Take by mouth.      . UNKNOWN TO PATIENT PT TAKE A BP MED; DO NOT KNOW NAME      . cyclobenzaprine (FLEXERIL) 5 MG tablet Take 5 mg by mouth 3 (three) times daily as needed.      . gabapentin (NEURONTIN) 100 MG capsule Take 100 mg by mouth 3 (three) times daily.        No Known Allergies Denies sensitivity to peanuts, shellfish, soy, latex, or  adhesives  ROS: Admits to glasses, partial dental plate in lower jaw;   Denies headache, vision changes, dysphagia, tinnitus, dizziness,  chest pain, shortness of breath, nausea, vomiting, diarrhea, dysuria, hematuria, pelvic pain, swelling of joints,easy bruising,  myalgias, arthralgias, skin rashes and except as is mentioned in the history of present illness, patient's review of systems is otherwise negative   Physical Exam    BP 112/78  Pulse 80  Temp 98.3 F (36.8 C) (Oral)  Ht 5' 7.5" (1.715 m)  Wt 151 lb (68.493 kg)  BMI 23.30 kg/m2  Neck: supple without masses or thyromegaly Lungs: clear to auscultation Heart: regular rate and rhythm Abdomen: soft, non-tender and no organomegaly Pelvic:EGBUS- wnl except female circumcision; vagina-atrophic; uterus-normal size without tenderdess, cervix without lesions or motion tenderness; adnexae-no tenderness or   masses Extremities:  no clubbing, cyanosis or edema   Assesment:  Endometrial Polyp                        Partial Language Barrier   Disposition:  A discussion was held with patient regarding the indication for her procedure(s) along with the risks, which include but are not limited to: reaction to anesthesia, damage to adjacent organs, infection, scarring  and excessive bleeding.  Patient verbalized understanding of these risks, had her questions answered and has consented to proceed with Hysteroscopy,  Dilatation, Curettage, and  Polypectomy  with Tru Clear at [facility] 02/07/12 @ 10:45 a.m.   CSN# [id number]   Leven Hoel J. Brianna Bennett, PA-C  for Dr. Naima Dillard   

## 2012-02-07 NOTE — Progress Notes (Signed)
Pt up in phase 2 and nauseated, vomiting noted, Dr Malen Gauze made aware, Phenergan suppository ordered, administer 25mg  pr per order Discharged home with family

## 2012-02-07 NOTE — Interval H&P Note (Signed)
History and Physical Interval Note:  02/07/2012 10:32 AM  Dawn Harrington  has presented today for surgery, with the diagnosis of Endometrial Polyp  The various methods of treatment have been discussed with the patient and family. After consideration of risks, benefits and other options for treatment, the patient has consented to  Procedure(s) (LRB): DILATATION & CURETTAGE/HYSTEROSCOPY WITH RESECTOCOPE (N/A) as a surgical intervention .  The patient's history has been reviewed, patient examined, no change in status, stable for surgery.  I have reviewed the patient's chart and labs.  Questions were answered to the patient's satisfaction.     [name]  Date of Initial H&P: 01/31/12  History reviewed, patient examined, no change in status, stable for surgery.

## 2012-02-07 NOTE — Transfer of Care (Signed)
Immediate Anesthesia Transfer of Care Note  Patient: Dawn Harrington  Procedure(s) Performed: Procedure(s) (LRB): DILATATION & CURETTAGE/HYSTEROSCOPY WITH RESECTOCOPE (N/A)  Patient Location: PACU  Anesthesia Type: General  Level of Consciousness: awake, alert  and oriented  Airway & Oxygen Therapy: Patient Spontanous Breathing and Patient connected to face mask oxygen  Post-op Assessment: Report given to PACU RN and Post -op Vital signs reviewed and stable  Post vital signs: Reviewed and stable  Complications: No apparent anesthesia complications

## 2012-02-21 ENCOUNTER — Encounter: Payer: Self-pay | Admitting: Obstetrics and Gynecology

## 2014-05-05 ENCOUNTER — Encounter (HOSPITAL_COMMUNITY): Payer: Self-pay | Admitting: Anesthesiology

## 2016-10-28 ENCOUNTER — Other Ambulatory Visit: Payer: Self-pay | Admitting: Gastroenterology

## 2016-10-28 DIAGNOSIS — R1011 Right upper quadrant pain: Secondary | ICD-10-CM

## 2016-10-31 ENCOUNTER — Other Ambulatory Visit: Payer: Managed Care, Other (non HMO)

## 2016-11-17 ENCOUNTER — Ambulatory Visit
Admission: RE | Admit: 2016-11-17 | Discharge: 2016-11-17 | Disposition: A | Discharge status: Home / Self Care | Payer: BLUE CROSS/BLUE SHIELD | Source: Ambulatory Visit | Attending: Gastroenterology | Admitting: Gastroenterology

## 2016-11-17 DIAGNOSIS — R1011 Right upper quadrant pain: Secondary | ICD-10-CM

## 2017-01-26 ENCOUNTER — Other Ambulatory Visit (HOSPITAL_COMMUNITY): Payer: Self-pay | Admitting: Gastroenterology

## 2017-01-26 DIAGNOSIS — R1011 Right upper quadrant pain: Secondary | ICD-10-CM

## 2017-02-06 ENCOUNTER — Ambulatory Visit (HOSPITAL_COMMUNITY)
Admission: RE | Admit: 2017-02-06 | Discharge: 2017-02-06 | Disposition: A | Discharge status: Home / Self Care | Payer: BLUE CROSS/BLUE SHIELD | Source: Ambulatory Visit | Attending: Gastroenterology | Admitting: Gastroenterology

## 2017-02-06 ENCOUNTER — Encounter (HOSPITAL_COMMUNITY): Payer: Self-pay

## 2018-03-30 ENCOUNTER — Other Ambulatory Visit: Payer: Self-pay | Admitting: Gastroenterology

## 2018-03-30 DIAGNOSIS — R1319 Other dysphagia: Secondary | ICD-10-CM

## 2018-03-30 DIAGNOSIS — R1310 Dysphagia, unspecified: Secondary | ICD-10-CM

## 2018-04-04 ENCOUNTER — Emergency Department (HOSPITAL_BASED_OUTPATIENT_CLINIC_OR_DEPARTMENT_OTHER): Payer: BLUE CROSS/BLUE SHIELD

## 2018-04-04 ENCOUNTER — Observation Stay (HOSPITAL_BASED_OUTPATIENT_CLINIC_OR_DEPARTMENT_OTHER)
Admission: EM | Admit: 2018-04-04 | Discharge: 2018-04-05 | Disposition: A | Discharge status: Home / Self Care | Payer: BLUE CROSS/BLUE SHIELD | Attending: Internal Medicine | Admitting: Internal Medicine

## 2018-04-04 ENCOUNTER — Observation Stay (HOSPITAL_BASED_OUTPATIENT_CLINIC_OR_DEPARTMENT_OTHER): Payer: BLUE CROSS/BLUE SHIELD

## 2018-04-04 ENCOUNTER — Other Ambulatory Visit: Payer: Self-pay

## 2018-04-04 ENCOUNTER — Encounter (HOSPITAL_BASED_OUTPATIENT_CLINIC_OR_DEPARTMENT_OTHER): Payer: Self-pay | Admitting: *Deleted

## 2018-04-04 ENCOUNTER — Observation Stay (HOSPITAL_COMMUNITY): Payer: BLUE CROSS/BLUE SHIELD

## 2018-04-04 DIAGNOSIS — Z79899 Other long term (current) drug therapy: Secondary | ICD-10-CM | POA: Diagnosis not present

## 2018-04-04 DIAGNOSIS — R0789 Other chest pain: Principal | ICD-10-CM | POA: Insufficient documentation

## 2018-04-04 DIAGNOSIS — R079 Chest pain, unspecified: Secondary | ICD-10-CM

## 2018-04-04 DIAGNOSIS — R072 Precordial pain: Secondary | ICD-10-CM

## 2018-04-04 DIAGNOSIS — K7689 Other specified diseases of liver: Secondary | ICD-10-CM | POA: Insufficient documentation

## 2018-04-04 DIAGNOSIS — Z7982 Long term (current) use of aspirin: Secondary | ICD-10-CM | POA: Diagnosis not present

## 2018-04-04 DIAGNOSIS — I340 Nonrheumatic mitral (valve) insufficiency: Secondary | ICD-10-CM | POA: Diagnosis not present

## 2018-04-04 DIAGNOSIS — N921 Excessive and frequent menstruation with irregular cycle: Secondary | ICD-10-CM | POA: Diagnosis not present

## 2018-04-04 DIAGNOSIS — I10 Essential (primary) hypertension: Secondary | ICD-10-CM | POA: Insufficient documentation

## 2018-04-04 DIAGNOSIS — R1011 Right upper quadrant pain: Secondary | ICD-10-CM

## 2018-04-04 DIAGNOSIS — K219 Gastro-esophageal reflux disease without esophagitis: Secondary | ICD-10-CM | POA: Diagnosis not present

## 2018-04-04 LAB — COMPREHENSIVE METABOLIC PANEL
ALT: 16 U/L (ref 0–44)
ANION GAP: 9 (ref 5–15)
AST: 19 U/L (ref 15–41)
Albumin: 3.9 g/dL (ref 3.5–5.0)
Alkaline Phosphatase: 52 U/L (ref 38–126)
BILIRUBIN TOTAL: 0.5 mg/dL (ref 0.3–1.2)
BUN: 7 mg/dL (ref 6–20)
CO2: 29 mmol/L (ref 22–32)
Calcium: 9.5 mg/dL (ref 8.9–10.3)
Chloride: 101 mmol/L (ref 98–111)
Creatinine, Ser: 0.63 mg/dL (ref 0.44–1.00)
GFR calc Af Amer: >60 mL/min (ref >60)
Glucose, Bld: 71 mg/dL (ref 70–99)
POTASSIUM: 3.7 mmol/L (ref 3.5–5.1)
Sodium: 139 mmol/L (ref 135–145)
TOTAL PROTEIN: 7.3 g/dL (ref 6.5–8.1)

## 2018-04-04 LAB — ECHOCARDIOGRAM COMPLETE
HEIGHTINCHES: 68 in
WEIGHTICAEL: 2336 [oz_av]

## 2018-04-04 LAB — CBC WITH DIFFERENTIAL/PLATELET
BASOS PCT: 0 %
Basophils Absolute: 0 10*3/uL (ref 0.0–0.1)
EOS ABS: 0.2 10*3/uL (ref 0.0–0.7)
EOS PCT: 3 %
HEMATOCRIT: 41.2 % (ref 36.0–46.0)
Hemoglobin: 14.5 g/dL (ref 12.0–15.0)
LYMPHS ABS: 2 10*3/uL (ref 0.7–4.0)
Lymphocytes Relative: 34 %
MCH: 31.4 pg (ref 26.0–34.0)
MCHC: 35.2 g/dL (ref 30.0–36.0)
MCV: 89.2 fL (ref 78.0–100.0)
Monocytes Absolute: 0.5 10*3/uL (ref 0.1–1.0)
Monocytes Relative: 9 %
NEUTROS PCT: 54 %
Neutro Abs: 3.1 10*3/uL (ref 1.7–7.7)
Platelets: 297 10*3/uL (ref 150–400)
RBC: 4.62 MIL/uL (ref 3.87–5.11)
RDW: 12.5 % (ref 11.5–15.5)
WBC: 5.7 10*3/uL (ref 4.0–10.5)

## 2018-04-04 LAB — MAGNESIUM: Magnesium: 2.2 mg/dL (ref 1.7–2.4)

## 2018-04-04 LAB — TROPONIN I

## 2018-04-04 MED ORDER — ASPIRIN EC 81 MG PO TBEC
81.0000 mg | DELAYED_RELEASE_TABLET | Freq: Every day | ORAL | Status: DC
  Administered 2018-04-05: 81 mg via ORAL
  Filled 2018-04-04: qty 1

## 2018-04-04 MED ORDER — PANTOPRAZOLE SODIUM 40 MG PO TBEC
40.0000 mg | DELAYED_RELEASE_TABLET | Freq: Every day | ORAL | Status: DC
  Administered 2018-04-05 (×2): 40 mg via ORAL
  Filled 2018-04-04 (×2): qty 1

## 2018-04-04 MED ORDER — ENOXAPARIN SODIUM 40 MG/0.4ML ~~LOC~~ SOLN
40.0000 mg | SUBCUTANEOUS | Status: DC
  Administered 2018-04-04: 40 mg via SUBCUTANEOUS
  Filled 2018-04-04: qty 0.4

## 2018-04-04 MED ORDER — NITROGLYCERIN 0.4 MG SL SUBL: 0.4000 mg | SUBLINGUAL_TABLET | SUBLINGUAL | Status: DC | PRN

## 2018-04-04 MED ORDER — VITAMIN D 1000 UNITS PO TABS
2000.0000 [IU] | ORAL_TABLET | Freq: Every day | ORAL | Status: DC
  Administered 2018-04-05: 2000 [IU] via ORAL
  Filled 2018-04-04: qty 2

## 2018-04-04 MED ORDER — ONDANSETRON HCL 4 MG/2ML IJ SOLN: 4.0000 mg | Freq: Four times a day (QID) | INTRAMUSCULAR | Status: DC | PRN

## 2018-04-04 MED ORDER — ASPIRIN 81 MG PO CHEW
324.0000 mg | CHEWABLE_TABLET | Freq: Once | ORAL | Status: AC
  Administered 2018-04-04: 324 mg via ORAL
  Filled 2018-04-04: qty 4

## 2018-04-04 MED ORDER — MORPHINE SULFATE (PF) 4 MG/ML IV SOLN
4.0000 mg | Freq: Once | INTRAVENOUS | Status: AC
  Administered 2018-04-04: 4 mg via INTRAVENOUS
  Filled 2018-04-04: qty 1

## 2018-04-04 MED ORDER — ALPRAZOLAM 0.25 MG PO TABS: 0.2500 mg | ORAL_TABLET | Freq: Every evening | ORAL | Status: DC | PRN

## 2018-04-04 MED ORDER — ENOXAPARIN SODIUM 40 MG/0.4ML ~~LOC~~ SOLN: 40.0000 mg | SUBCUTANEOUS | Status: DC

## 2018-04-04 MED ORDER — MORPHINE SULFATE (PF) 4 MG/ML IV SOLN: 4.0000 mg | INTRAVENOUS | Status: DC | PRN

## 2018-04-04 MED ORDER — NITROGLYCERIN 0.4 MG SL SUBL
0.4000 mg | SUBLINGUAL_TABLET | SUBLINGUAL | Status: DC | PRN
  Administered 2018-04-04 (×3): 0.4 mg via SUBLINGUAL
  Filled 2018-04-04 (×2): qty 1

## 2018-04-04 MED ORDER — AMLODIPINE BESYLATE 5 MG PO TABS
5.0000 mg | ORAL_TABLET | Freq: Every day | ORAL | Status: DC
  Filled 2018-04-04: qty 1

## 2018-04-04 MED ORDER — ACETAMINOPHEN 325 MG PO TABS: 650.0000 mg | ORAL_TABLET | ORAL | Status: DC | PRN

## 2018-04-04 NOTE — H&P (Signed)
History and Physical    Dawn Harrington RUE:[id number] DOB: 01-Sep-1960 DOA: 04/04/2018  PCP: Rocky Morel, MD   Patient coming from: Home.  I have personally briefly reviewed patient's old medical records in [facility]  Chief Complaint: Suprasternal pain.  HPI: Dawn Harrington is a 57 y.o. female with medical history significant of normal menstrual cycle, history of metrorrhagia, condyloma, GERD, hypertension, history of syncopal event about 10 years ago with normal cardiac cath per patient [facility] who was transferred from [facility] after presenting there with complaints of substernal pain radiated to epigastric area and RUQ.  Associated symptoms were diaphoresis, nausea, but no emesis.  She occasionally gets flatulence.  She denies dyspnea, palpitations, dizziness, PND, orthopnea or pitting edema of the lower extremities.  No fever, sore throat, rhinorrhea, headache, wheezing, hemoptysis, diarrhea, constipation, melena or hematochezia.  Denies urinary symptoms.  ED Course: She will vital signs temperature 98.4 F, pulse 74, respirations 11, pressure 137/58 mmHg and O2 sat 100% on room air.  Patient was given 4 mg of morphine 324 mg of aspirin in the emergency department.  EKG is showing anterior leads T wave changes when compared to previous. CBC, CMP, troponin and magnesium levels were within normal limits.  Chest radiograph did not show any acute cardiopulmonary pathology.  Review of Systems: As per HPI otherwise 10 point review of systems negative.   Past Medical History:  Diagnosis Date  . Abnormal menstrual cycle 2006  . Condyloma 03/2005  . Dysrhythmia 2010   syncopal event-vf witnessed-shocked-catheterization  . GERD (gastroesophageal reflux disease)    rarely  . H/O dysmenorrhea 2005  . Hip pain 2008  . Hx of metrorrhagia 2005  . Hypertension    Norvasc    Past Surgical History:  Procedure Laterality Date  . APPENDECTOMY    . heart cathetherization  2010   . SHOULDER SURGERY  2012   [facility]  . TONSILLECTOMY       reports that she has never smoked. She has never used smokeless tobacco. She reports that she does not drink alcohol or use drugs.  No Known Allergies   Prior to Admission medications   Medication Sig Start Date End Date Taking? Authorizing Provider  amLODipine (NORVASC) 5 MG tablet Take 5 mg by mouth daily.   Yes [provider]  Cholecalciferol 2000 UNITS CAPS Take 1 capsule by mouth daily.   Yes [provider]  aspirin 81 MG tablet Take 81 mg by mouth daily.    [provider]    Physical Exam: Vitals:   04/04/18 1502 04/04/18 1507 04/04/18 1614 04/04/18 1617  BP: 125/76 110/81 133/81   Pulse: 78 78 61   Resp: 17 15 15    Temp:   98.6 F (37 C)   TempSrc:   Oral   SpO2: 100% 98% 100%   Weight:    66.2 kg  Height:    5\' 8"  (1.727 m)    Constitutional: NAD, calm, comfortable Eyes: PERRL, lids and conjunctivae normal ENMT: Mucous membranes are moist. Posterior pharynx clear of any exudate or lesions.  Neck: normal, supple, no masses, no thyromegaly Respiratory: clear to auscultation bilaterally, no wheezing, no crackles. Normal respiratory effort. No accessory muscle use.  Cardiovascular: Regular rate and rhythm, no murmurs / rubs / gallops. No extremity edema. 2+ pedal pulses. No carotid bruits.  Abdomen: Mild epigastric and RUQ tenderness, no guarding/rebound/masses palpated. No hepatosplenomegaly. Bowel sounds positive.  Musculoskeletal: no clubbing / cyanosis. No joint deformity  upper and lower extremities. Good ROM, no contractures. Normal muscle tone.  Skin: no rashes, lesions, ulcers. No induration on limited dermatological examination. Neurologic: CN 2-12 grossly intact. Sensation intact, DTR normal. Strength 5/5 in all 4.  Psychiatric: Normal judgment and insight. Alert and oriented x 3. Normal mood.   Labs on Admission: I have personally reviewed following labs and imaging  studies  CBC: Recent Labs  Lab 04/04/18 1229  WBC 5.7  NEUTROABS 3.1  HGB 14.5  HCT 41.2  MCV 89.2  PLT 297   Basic Metabolic Panel: Recent Labs  Lab 04/04/18 1229  NA 139  K 3.7  CL 101  CO2 29  GLUCOSE 71  BUN 7  CREATININE 0.63  CALCIUM 9.5  MG 2.2   GFR: Estimated Creatinine Clearance: 76.4 mL/min (by C-G formula based on SCr of 0.63 mg/dL). Liver Function Tests: Recent Labs  Lab 04/04/18 1229  AST 19  ALT 16  ALKPHOS 52  BILITOT 0.5  PROT 7.3  ALBUMIN 3.9   No results for input(s): LIPASE, AMYLASE in the last 168 hours. No results for input(s): AMMONIA in the last 168 hours. Coagulation Profile: No results for input(s): INR, PROTIME in the last 168 hours. Cardiac Enzymes: Recent Labs  Lab 04/04/18 1229  TROPONINI <0.03   BNP (last 3 results) No results for input(s): PROBNP in the last 8760 hours. HbA1C: No results for input(s): HGBA1C in the last 72 hours. CBG: No results for input(s): GLUCAP in the last 168 hours. Lipid Profile: No results for input(s): CHOL, HDL, LDLCALC, TRIG, CHOLHDL, LDLDIRECT in the last 72 hours. Thyroid Function Tests: No results for input(s): TSH, T4TOTAL, FREET4, T3FREE, THYROIDAB in the last 72 hours. Anemia Panel: No results for input(s): VITAMINB12, FOLATE, FERRITIN, TIBC, IRON, RETICCTPCT in the last 72 hours. Urine analysis: No results found for: COLORURINE, APPEARANCEUR, LABSPEC, PHURINE, GLUCOSEU, HGBUR, BILIRUBINUR, KETONESUR, PROTEINUR, UROBILINOGEN, NITRITE, LEUKOCYTESUR  Radiological Exams on Admission: Dg Chest 2 View  Result Date: 04/04/2018 CLINICAL DATA:  Chest pain EXAM: CHEST - 2 VIEW COMPARISON:  December 06, 2008 FINDINGS: The lungs are clear. The heart size and pulmonary vascularity are normal. No adenopathy. No pneumothorax. No bone lesions. IMPRESSION: No edema or consolidation. Electronically Signed   By: Bretta Bang III M.D.   On: 04/04/2018 11:39    EKG: Independently reviewed. Vent. rate  71 BPM PR interval * ms QRS duration 78 ms QT/QTc 386/420 ms P-R-T axes 70 18 0 Sinus rhythm Low voltage, precordial leads Borderline T abnormalities, anterior leads  Assessment/Plan Principal Problem:   Chest pain The pain is atypical and substernal. I suspect gallstones or GB dysmotility since she had a similar picture last year. However, EKG is showing anterior leads T wave changes when compared to previous. Will continue trending troponin level.   Check echocardiogram. Continue aspirin. Continue BP control. Follow-up with cardiology suggestions. Check RUQ ultrasound. If this is normal, the patient should return to her PCP to reschedule her NM Hepato with GB ejection fraction scan.  Active Problems:   Hypertension Continue amlodipine 5 mg p.o. daily. Monitor blood pressure.    GERD (gastroesophageal reflux disease) Start Protonix 40 mg p.o. daily.   DVT prophylaxis: Lovenox SQ. Code Status: Full code. Family Communication: Her daughter was present in her room. Disposition Plan: Observation for chest pain rule out. Consults called: Cardiology (Dr. Eldridge Dace). Admission status: Observation/telemetry.   Bobette Mo MD [facility] Pager [phone].  If 7PM-7AM, please contact night-coverage [email or web address] Password [facility]  04/04/2018,  4:39 PM

## 2018-04-04 NOTE — Consult Note (Addendum)
Cardiology Consultation:   Patient ID: Dawn Harrington MRN: [id number]; DOB: 24-Oct-1960  Admit date: 04/04/2018 Date of Consult: 04/04/2018  Primary Care Provider: Rocky Morel, MD Primary Cardiologist: Lance Muss, MD  Primary Electrophysiologist:  None    Patient Profile:   Dawn Harrington is a 57 y.o. female with a hx of HTN, prior heart catheterization (intervention unknown, approximately 9-10 years ago), esophageal stricturer, and GERD who is being seen today for the evaluation of chest pain at the request of Dr. Robb Matar.  History of Present Illness:   Ms. Guimond has a history of heart cath 9-10 years ago. She is unsure if she has a stent. She did not follow up because she did not have insurance.   Pt presented to [facility] today with chest pain that radiated to her RUQ. She was transferred to [facility] for further evaluation.   On my interview, she states that she has been following with GI for her GERD. Two days ago, she experienced epigastric pain that radiated to  Her right upper abdominal quadrant. This occurred when she laid down in bed to sleep. The next day, she experienced the same pain intermittently throughout the day. The pain was worse when she laid down last night to sleep. She reports nausea and diaphoresis with the pain, but no shortness of breath, dizziness, or syncope.  She woke up at 2:30am with the pain. 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 She states that she has taken prilosec, which helped her pain. She is scheduled to have an esophageal study on the 7th with GI. She does not recall what kind of chest pain she had prior to her heart cath 9-10 years ago. I do not have records of this heart cath or details of her prior history.   She is from [place] and has been in the [place] for 15 years. She has 7 children and takes care of her mother. She is very active and denies exertional chest pain.    Initial troponin was negative. EKG with TWI inferiorly and TWI/flattening V3/4/5, poor R wave progression, no prior since 2013.   Past Medical History:  Diagnosis Date  . Abnormal menstrual cycle 2006  . Condyloma 03/2005  . Dysrhythmia 2010   syncopal event-vf witnessed-shocked-catheterization  . GERD (gastroesophageal reflux disease)    rarely  . H/O dysmenorrhea 2005  . Hip pain 2008  . Hx of metrorrhagia 2005  . Hypertension    Norvasc    Past Surgical History:  Procedure Laterality Date  . APPENDECTOMY    . heart cathetherization  2010  . SHOULDER SURGERY  2012   [facility]  . TONSILLECTOMY       Home Medications:  Prior to Admission medications   Medication Sig Start Date End Date Taking? Authorizing Provider  amLODipine (NORVASC) 5 MG tablet Take 5 mg by mouth daily.   Yes [provider]  Cholecalciferol 2000 UNITS CAPS Take 1 capsule by mouth daily.   Yes [provider]  aspirin 81 MG tablet Take 81 mg by mouth daily.    [provider]    Inpatient Medications: Scheduled Meds: . enoxaparin (LOVENOX) injection  40 mg Subcutaneous Q24H   Continuous Infusions:  PRN Meds: acetaminophen, ALPRAZolam, nitroGLYCERIN, nitroGLYCERIN, ondansetron (ZOFRAN) IV  Allergies:   No Known Allergies  Social History:   Social History   Socioeconomic History  . Marital status:  Married    Spouse name: Not on file  . Number of children: Not on file  . Years of education: Not on file  . Highest education level: Not on file  Occupational History  . Not on file  Social Needs  . Financial resource strain: Not on file  . Food insecurity:    Worry: Not on file    Inability: Not on file  . Transportation needs:    Medical: Not on file    Non-medical: Not on file  Tobacco Use  . Smoking status: Never Smoker  . Smokeless tobacco: Never Used  Substance and Sexual Activity  . Alcohol use: No  . Drug use: No  . Sexual activity: Not  Currently    Birth control/protection: Post-menopausal, None  Lifestyle  . Physical activity:    Days per week: Not on file    Minutes per session: Not on file  . Stress: Not on file  Relationships  . Social connections:    Talks on phone: Not on file    Gets together: Not on file    Attends religious service: Not on file    Active member of club or organization: Not on file    Attends meetings of clubs or organizations: Not on file    Relationship status: Not on file  . Intimate partner violence:    Fear of current or ex partner: Not on file    Emotionally abused: Not on file    Physically abused: Not on file    Forced sexual activity: Not on file  Other Topics Concern  . Not on file  Social History Narrative  . Not on file    Family History:   Pt does not know her family history.   ROS:  Please see the history of present illness.   All other ROS reviewed and negative.     Physical Exam/Data:   Vitals:   04/04/18 1502 04/04/18 1507 04/04/18 1614 04/04/18 1617  BP: 125/76 110/81 133/81   Pulse: 78 78 61   Resp: 17 15 15    Temp:   98.6 F (37 C)   TempSrc:   Oral   SpO2: 100% 98% 100%   Weight:    66.2 kg  Height:    5\' 8"  (1.727 m)   No intake or output data in the 24 hours ending 04/04/18 1658 Filed Weights   04/04/18 1617  Weight: 66.2 kg   Body mass index is 22.2 kg/m.  General:  Well nourished, well developed, in no acute distress HEENT: normal Neck: no JVD Vascular: No carotid bruits Cardiac:  normal S1, S2; RRR; no murmur  Lungs:  clear to auscultation bilaterally, no wheezing, rhonchi or rales  Abd: soft, nontender, no hepatomegaly  Ext: no edema Musculoskeletal:  No deformities, BUE and BLE strength normal and equal Skin: warm and dry  Neuro:  CNs 2-12 intact, no focal abnormalities noted Psych:  Normal affect   EKG:  The EKG was personally reviewed and demonstrates:  Sinus with poor R wave progression and TWI/flattening inferior leads and  V3/4/5 Telemetry:  Telemetry was personally reviewed and demonstrates:  sinus  Relevant CV Studies:  none  Laboratory Data:  Chemistry Recent Labs  Lab 04/04/18 1229  NA 139  K 3.7  CL 101  CO2 29  GLUCOSE 71  BUN 7  CREATININE 0.63  CALCIUM 9.5  GFRNONAA >60  GFRAA >60  ANIONGAP 9    Recent Labs  Lab 04/04/18 1229  PROT  7.3  ALBUMIN 3.9  AST 19  ALT 16  ALKPHOS 52  BILITOT 0.5   Hematology Recent Labs  Lab 04/04/18 1229  WBC 5.7  RBC 4.62  HGB 14.5  HCT 41.2  MCV 89.2  MCH 31.4  MCHC 35.2  RDW 12.5  PLT 297   Cardiac Enzymes Recent Labs  Lab 04/04/18 1229  TROPONINI <0.03   No results for input(s): TROPIPOC in the last 168 hours.  BNPNo results for input(s): BNP, PROBNP in the last 168 hours.  DDimer No results for input(s): DDIMER in the last 168 hours.  Radiology/Studies:  Dg Chest 2 View  Result Date: 04/04/2018 CLINICAL DATA:  Chest pain EXAM: CHEST - 2 VIEW COMPARISON:  December 06, 2008 FINDINGS: The lungs are clear. The heart size and pulmonary vascularity are normal. No adenopathy. No pneumothorax. No bone lesions. IMPRESSION: No edema or consolidation. Electronically Signed   By: Bretta Bang III M.D.   On: 04/04/2018 11:39    Assessment and Plan:   1. Chest pain / epigastric pain radiating to RUQ - patient presents with atypical pain in the epigastric area that radiates to her RUQ, not exertional, associated with laying down in bed and occurs with nausea, seems to be moderately relieved with GI medication - initial troponin negative - EKG with poor R wave progression, TWI V3/4/5 -  Given her history of prior heart cath (no records), she would benefit from CT coronary tomorrow - continue to trend troponins - no heparin unless positive enzymes - if positive enzymes, will switch to definitive angiography instead - NPO at [facility]  ** needs an 18 gauge PIV in [facility] or higher ** recommend 25 mg lopressor prior to CT coronary if heart rate  above 70   2. HTN - PCP follows HTN and has recently changed her norvasc to lisinopril due to lower extremity swelling - pressures controlled    3. GERD - she currently follows with GI and is scheduled for an esophageal study on 04/09/18      For questions or updates, please contact [facility] Please consult [email or web address] for contact info under     Signed, Marcelino Duster, PA  04/04/2018 4:58 PM   I have examined the patient and reviewed assessment and plan and discussed with patient.  Agree with above as stated.  Mild ECG changes noted although sx seem more GI related.  History of cath without PCI 9 years ago.  WIl plan for cardiac CT.  Hopefully, this will r/o CAD.  COntinue preventive therapy.   Lance Muss

## 2018-04-04 NOTE — Progress Notes (Signed)
ECHO at bedside.

## 2018-04-04 NOTE — Progress Notes (Signed)
  Echocardiogram 2D Echocardiogram has been performed.  Delcie Roch 04/04/2018, 5:32 PM

## 2018-04-04 NOTE — ED Notes (Signed)
Report given to [facility] with [facility] and update called to Alexia RN, receiving nurse at [facility]

## 2018-04-04 NOTE — ED Notes (Signed)
Ed MD aware of chest pain

## 2018-04-04 NOTE — ED Provider Notes (Signed)
[facility] EMERGENCY DEPARTMENT Provider Note   CSN: [id number] Arrival date & time: 04/04/18  4098     History   Chief Complaint Chief Complaint  Patient presents with  . Chest Pain    HPI Dawn Harrington is a 57 y.o. female.  HPI   Presents with chest pain, began around 230AM Tried to work but had CP and came here Hx of having VF, cath, prior MI 2010 Radiates from lower chest radiating to the left, sharp and burning pain Worried another heart attack. Last MI had VF and didn't have pain before CP coming and going for months, worse at night, but last night it started and didn't get better. Not specifically having exertional pain Cough, was planning on swallow study as outpt, months of swallowing problems, months of cough, dry No hx of DVT, PE, no long trips car or airplane, no recent surgeries No cigarettes, no other drugs No fam hx of early MI.  Has not seen Cardiologist since 2013, hasn't had insurance until recently, has seen family doctor  Pain currently 5/10  Past Medical History:  Diagnosis Date  . Abnormal menstrual cycle 2006  . Condyloma 03/2005  . Dysrhythmia 2010   syncopal event-vf witnessed-shocked-catheterization  . GERD (gastroesophageal reflux disease)    rarely  . H/O dysmenorrhea 2005  . Hip pain 2008  . Hx of metrorrhagia 2005  . Hypertension    Norvasc    Patient Active Problem List   Diagnosis Date Noted  . Chest pain 04/04/2018  . Hypertension 04/04/2018  . GERD (gastroesophageal reflux disease) 04/04/2018    Past Surgical History:  Procedure Laterality Date  . APPENDECTOMY    . heart cathetherization  2010  . SHOULDER SURGERY  2012   [place]  . TONSILLECTOMY       OB History    Gravida  7   Para  7   Term      Preterm      AB  0   Living  7     SAB  0   TAB      Ectopic      Multiple      Live Births  6            Home Medications    Prior to Admission medications   Medication Sig Start  Date End Date Taking? Authorizing Provider  amLODipine (NORVASC) 5 MG tablet Take 5 mg by mouth daily.   Yes [provider]  Cholecalciferol 2000 UNITS CAPS Take 1 capsule by mouth daily.   Yes [provider]  aspirin 81 MG tablet Take 81 mg by mouth daily.    [provider]    Family History History reviewed. No pertinent family history.  Social History Social History   Tobacco Use  . Smoking status: Never Smoker  . Smokeless tobacco: Never Used  Substance Use Topics  . Alcohol use: No  . Drug use: No     Allergies   Patient has no known allergies.   Review of Systems Review of Systems  Constitutional: Negative for fever.  HENT: Negative for sore throat.   Eyes: Negative for visual disturbance.  Respiratory: Positive for cough. Negative for shortness of breath.   Cardiovascular: Positive for chest pain.  Gastrointestinal: Positive for nausea. Negative for abdominal pain and vomiting.  Genitourinary: Negative for difficulty urinating and dysuria.  Musculoskeletal: Negative for back pain and neck pain.  Skin: Negative for rash.  Neurological: Negative for  syncope and headaches.     Physical Exam Updated Vital Signs BP 133/81   Pulse 61   Temp 98.6 F (37 C) (Oral)   Resp 15   Ht 5\' 8"  (1.727 m)   Wt 66.2 kg   SpO2 100%   BMI 22.20 kg/m   Physical Exam  Constitutional: She is oriented to person, place, and time. She appears well-developed and well-nourished. No distress.  HENT:  Head: Normocephalic and atraumatic.  Eyes: Conjunctivae and EOM are normal.  Neck: Normal range of motion.  Cardiovascular: Normal rate, regular rhythm, normal heart sounds and intact distal pulses. Exam reveals no gallop and no friction rub.  No murmur heard. Pulmonary/Chest: Effort normal and breath sounds normal. No respiratory distress. She has no wheezes. She has no rales.  Abdominal: Soft. She exhibits no distension. There is no tenderness. There  is no guarding.  Musculoskeletal: She exhibits no edema or tenderness.  Neurological: She is alert and oriented to person, place, and time.  Skin: Skin is warm and dry. No rash noted. She is not diaphoretic. No erythema.  Nursing note and vitals reviewed.    ED Treatments / Results  Labs (all labs ordered are listed, but only abnormal results are displayed) Labs Reviewed  CBC WITH DIFFERENTIAL/PLATELET  COMPREHENSIVE METABOLIC PANEL  TROPONIN I  MAGNESIUM  TROPONIN I  TROPONIN I  HIV ANTIBODY (ROUTINE TESTING W REFLEX)    EKG EKG Interpretation  Date/Time:  Wednesday April 04 2018 10:15:49 EDT Ventricular Rate:  69 PR Interval:    QRS Duration: 73 QT Interval:  386 QTC Calculation: 414 R Axis:   21 Text Interpretation:  Sinus rhythm Abnormal R-wave progression, early transition Borderline repolarization abnormality New TWI and biphasic TW in anterior leads Confirmed by Alvira Monday ([id number]) on 04/04/2018 12:33:26 PM   Radiology Dg Chest 2 View  Result Date: 04/04/2018 CLINICAL DATA:  Chest pain EXAM: CHEST - 2 VIEW COMPARISON:  December 06, 2008 FINDINGS: The lungs are clear. The heart size and pulmonary vascularity are normal. No adenopathy. No pneumothorax. No bone lesions. IMPRESSION: No edema or consolidation. Electronically Signed   By: Bretta Bang III M.D.   On: 04/04/2018 11:39    Procedures Procedures (including critical care time)  Medications Ordered in ED Medications  nitroGLYCERIN (NITROSTAT) SL tablet 0.4 mg (0.4 mg Sublingual Given 04/04/18 1503)  nitroGLYCERIN (NITROSTAT) SL tablet 0.4 mg (has no administration in time range)  enoxaparin (LOVENOX) injection 40 mg (has no administration in time range)  acetaminophen (TYLENOL) tablet 650 mg (has no administration in time range)  ondansetron (ZOFRAN) injection 4 mg (has no administration in time range)  ALPRAZolam (XANAX) tablet 0.25 mg (has no administration in time range)  aspirin EC tablet 81 mg  (has no administration in time range)  amLODipine (NORVASC) tablet 5 mg (has no administration in time range)  cholecalciferol (VITAMIN D) tablet 2,000 Units (has no administration in time range)  aspirin chewable tablet 324 mg (324 mg Oral Given 04/04/18 1143)  morphine 4 MG/ML injection 4 mg (4 mg Intravenous Given 04/04/18 1511)     Initial Impression / Assessment and Plan / ED Course  I have reviewed the triage vital signs and the nursing notes.  Pertinent labs & imaging results that were available during my care of the patient were reviewed by me and considered in my medical decision making (see chart for details).     57yo female with reported hx of CAD with hx of VFib/defibrillation and catheterization  in 2010 presents with concern for chest pain.  EKG shows T wave changes which are new in comparison to prior.  Chest x-ray shows no evidence of pneumonia or pneumothorax.  Troponin checked and negative.Hx, physical exam not consistent with PE or dissection. Symptoms may be GERD, but given improvement with nitro, hx of CAD, will admit for further eval.  Given aspirin and nitroglycerin with initial resolution of CP.  Given hx of CAD with CP and no recent evaluation, discussed with Cardiology Dr. Eldridge Dace, and we agree hospitalist admit most appropriate and Cardiology will consult.  While in the ED, just prior to transfer, reports her pain has returned. Given additional nitro with slight improvement, followed by morphine with resolution.    Final Clinical Impressions(s) / ED Diagnoses   Final diagnoses:  Chest pain, unspecified type    ED Discharge Orders    None       Alvira Monday, MD 04/04/18 1610

## 2018-04-04 NOTE — ED Triage Notes (Signed)
Sharp burning sensation to mid chest  radiates to RUQ started 2 days ago

## 2018-04-04 NOTE — ED Notes (Signed)
ED Provider at bedside. 

## 2018-04-05 ENCOUNTER — Observation Stay (HOSPITAL_COMMUNITY): Payer: BLUE CROSS/BLUE SHIELD

## 2018-04-05 DIAGNOSIS — K219 Gastro-esophageal reflux disease without esophagitis: Secondary | ICD-10-CM | POA: Diagnosis not present

## 2018-04-05 DIAGNOSIS — R0789 Other chest pain: Secondary | ICD-10-CM | POA: Diagnosis not present

## 2018-04-05 DIAGNOSIS — R072 Precordial pain: Secondary | ICD-10-CM | POA: Diagnosis not present

## 2018-04-05 DIAGNOSIS — I10 Essential (primary) hypertension: Secondary | ICD-10-CM | POA: Diagnosis not present

## 2018-04-05 DIAGNOSIS — R079 Chest pain, unspecified: Secondary | ICD-10-CM | POA: Diagnosis not present

## 2018-04-05 DIAGNOSIS — K7689 Other specified diseases of liver: Secondary | ICD-10-CM | POA: Diagnosis not present

## 2018-04-05 LAB — TROPONIN I: Troponin I: <0.03 ng/mL (ref <0.03)

## 2018-04-05 MED ORDER — NITROGLYCERIN 0.4 MG SL SUBL
0.8000 mg | SUBLINGUAL_TABLET | Freq: Once | SUBLINGUAL | Status: AC
  Administered 2018-04-05: 0.8 mg via SUBLINGUAL

## 2018-04-05 MED ORDER — METOPROLOL TARTRATE 5 MG/5ML IV SOLN
INTRAVENOUS | Status: AC
  Filled 2018-04-05: qty 5

## 2018-04-05 MED ORDER — METOPROLOL TARTRATE 5 MG/5ML IV SOLN
5.0000 mg | INTRAVENOUS | Status: DC | PRN
  Administered 2018-04-05: 5 mg via INTRAVENOUS

## 2018-04-05 MED ORDER — NITROGLYCERIN 0.4 MG SL SUBL
SUBLINGUAL_TABLET | SUBLINGUAL | Status: AC
  Filled 2018-04-05: qty 2

## 2018-04-05 MED ORDER — IOPAMIDOL (ISOVUE-370) INJECTION 76%
100.0000 mL | Freq: Once | INTRAVENOUS | Status: AC
  Administered 2018-04-05: 100 mL via INTRAVENOUS

## 2018-04-05 NOTE — Progress Notes (Signed)
Pt slept through the night without any problems. No pain stated. Will continue to monitor pt.

## 2018-04-05 NOTE — Discharge Summary (Signed)
[facility] Discharge Summary   Patient: Dawn Harrington ZOX:[id number]   PCP: Rocky Morel, MD DOB: 1961-01-16   Date of admission: 04/04/2018   Date of discharge: 04/05/2018   Discharge Diagnoses:  Principal Problem:   Chest pain Active Problems:   Hypertension   GERD (gastroesophageal reflux disease)   Admitted From: home Disposition:  home  Recommendations for Outpatient Follow-up:  1. Please follow up with PCP in 1 week  2. Please follow-up with Dr. Dulce Sellar as scheduled on 04/09/2018.  Follow-up Information    Rocky Morel, MD. Schedule an appointment as soon as possible for a visit in 1 week(s).   Specialty:  Internal Medicine Contact information: [address] [place] [address] [phone]          Diet recommendation: cardiac diet  Activity: The patient is advised to gradually reintroduce usual activities.  Discharge Condition: good  Code Status: full code  History of present illness: As per the H and P dictated on admission, "Dawn Harrington is a 57 y.o. female with medical history significant of normal menstrual cycle, history of metrorrhagia, condyloma, GERD, hypertension, history of syncopal event about 10 years ago with normal cardiac cath per patient [place] [facility] who was transferred from [facility] after presenting there with complaints of substernal pain radiated to epigastric area and RUQ.  Associated symptoms were diaphoresis, nausea, but no emesis.  She occasionally gets flatulence.  She denies dyspnea, palpitations, dizziness, PND, orthopnea or pitting edema of the lower extremities.  No fever, sore throat, rhinorrhea, headache, wheezing, hemoptysis, diarrhea, constipation, melena or hematochezia.  Denies urinary symptoms."  Hospital Course:  Summary of her active problems in the hospital is as following. Chest pain. Pain is atypical. Patient also had some right upper quadrant tenderness. Ultrasound  unremarkable. Echocardiogram, cardiac CT, serial troponins are all unremarkable. Patient's pain resolved on its own in the morning. No change in medication from cardiology perspective. Outpatient follow-up with PCP recommended. Patient already scheduled to follow-up with GI outpatient on 04/09/2018.  Essential hypertension. Continue home medication.  GERD. Continue PPI.  All other chronic medical condition were stable during the hospitalization.  Patient was ambulatory without any assistance. On the day of the discharge the patient's vitals were stable , and no other acute medical condition were reported by patient. the patient was felt safe to be discharge at home with family.  Consultants: cardiology  Procedures: Echocardiogram  Cardiac CT  DISCHARGE MEDICATION: Allergies as of 04/05/2018   No Known Allergies     Medication List    TAKE these medications   aspirin 81 MG tablet Take 81 mg by mouth daily as needed for pain.   Cholecalciferol 2000 units Caps Take 2,000 Units by mouth daily.   lisinopril 5 MG tablet Commonly known as:  PRINIVIL,ZESTRIL Take 5 mg by mouth daily.   omeprazole 20 MG capsule Commonly known as:  PRILOSEC Take 20 mg by mouth 2 (two) times daily.      No Known Allergies Discharge Instructions    Diet - low sodium heart healthy   Complete by:  As directed    Discharge instructions   Complete by:  As directed    It is important that you read following instructions as well as go over your medication list with RN to help you understand your care after this hospitalization.  Discharge Instructions: Please follow-up with PCP in one week  Please request your primary care physician to go over all Hospital Tests and  Procedure/Radiological results at the follow up,  Please get all Hospital records sent to your PCP by signing hospital release before you go home.   Do not take more than prescribed Pain, Sleep and Anxiety Medications. You were  cared for by a hospitalist during your hospital stay. If you have any questions about your discharge medications or the care you received while you were in the hospital after you are discharged, you can call the unit and ask to speak with the hospitalist on call if the hospitalist that took care of you is not available.  Once you are discharged, your primary care physician will handle any further medical issues. Please note that NO REFILLS for any discharge medications will be authorized once you are discharged, as it is imperative that you return to your primary care physician (or establish a relationship with a primary care physician if you do not have one) for your aftercare needs so that they can reassess your need for medications and monitor your lab values. You Must read complete instructions/literature along with all the possible adverse reactions/side effects for all the Medicines you take and that have been prescribed to you. Take any new Medicines after you have completely understood and accept all the possible adverse reactions/side effects. Wear Seat belts while driving. If you have smoked or chewed Tobacco in the last 2 yrs please stop smoking and/or stop any Recreational drug use.   Increase activity slowly   Complete by:  As directed      Discharge Exam: Filed Weights   04/04/18 1617  Weight: 66.2 kg   Vitals:   04/05/18 1110 04/05/18 1417  BP: (!) 110/57 (!) 92/42  Pulse: 68 73  Resp: 16 16  Temp: 98.2 F (36.8 C) 98 F (36.7 C)  SpO2: 98% 100%   General: Appear in no distress, no Rash; Oral Mucosa moist. Cardiovascular: S1 and S2 Present, no Murmur, no JVD Respiratory: Bilateral Air entry present and Clear to Auscultation, no Crackles, no wheezes Abdomen: Bowel Sound present, Soft and no tenderness Extremities: no Pedal edema, no calf tenderness Neurology: Grossly no focal neuro deficit.  The results of significant diagnostics from this hospitalization (including  imaging, microbiology, ancillary and laboratory) are listed below for reference.    Significant Diagnostic Studies: Dg Chest 2 View  Result Date: 04/04/2018 CLINICAL DATA:  Chest pain EXAM: CHEST - 2 VIEW COMPARISON:  December 06, 2008 FINDINGS: The lungs are clear. The heart size and pulmonary vascularity are normal. No adenopathy. No pneumothorax. No bone lesions. IMPRESSION: No edema or consolidation. Electronically Signed   By: Bretta Bang III M.D.   On: 04/04/2018 11:39   Ct Coronary Morph W/cta Cor W/score W/ca W/cm &/or Wo/cm  Addendum Date: 04/05/2018   ADDENDUM REPORT: 04/05/2018 13:12 CLINICAL DATA:  57 year old female with atypical chest pain. EXAM: Cardiac/Coronary  CT TECHNIQUE: The patient was scanned on a [employer]. FINDINGS: A 120 kV prospective scan was triggered in the descending thoracic aorta at 111 HU's. Axial non-contrast 3 mm slices were carried out through the heart. The data set was analyzed on a dedicated work station and scored using the Agatson method. Gantry rotation speed was 250 msecs and collimation was .6 mm. No beta blockade and 0.8 mg of sl NTG was given. The 3D data set was reconstructed in 5% intervals of the 67-82 % of the R-R cycle. Diastolic phases were analyzed on a dedicated work station using MPR, MIP and VRT modes. The patient received 80  cc of contrast. Aorta:  Normal size.  No calcifications.  No dissection. Aortic Valve:  Trileaflet.  No calcifications. Coronary Arteries:  Normal coronary origin.  Right dominance. RCA is a large dominant artery that gives rise to PDA and PLVB. There is no plaque. Left main is a large artery that gives rise to LAD and LCX arteries. Left main has no CAD. LAD is a large vessel that has no plaque. LCX is a non-dominant artery that gives rise to one large OM1 branch. There is no plaque. Other findings: Normal pulmonary vein drainage into the left atrium. Normal let atrial appendage without a thrombus. Normal size of  the pulmonary artery. IMPRESSION: 1. Coronary calcium score of 0. This was 0 percentile for age and sex matched control. 2. Normal coronary origin with right dominance. 3. No evidence of CAD. Electronically Signed   By: Tobias Alexander   On: 04/05/2018 13:12   Result Date: 04/05/2018 EXAM: OVER-READ INTERPRETATION  CT CHEST The following report is an over-read performed by radiologist Dr. Charlett Nose of [facility] on 04/05/2018. This over-read does not include interpretation of cardiac or coronary anatomy or pathology. The coronary CTA interpretation by the cardiologist is attached. COMPARISON:  None. FINDINGS: Vascular: Heart is normal size.  Visualized aorta is normal caliber. Mediastinum/Nodes: No adenopathy in the lower mediastinum or hila. Lungs/Pleura: Minimal dependent atelectasis. No confluent opacities or effusions. Upper Abdomen: Imaging into the upper abdomen shows no acute findings. Musculoskeletal: Chest wall soft tissues are unremarkable. No acute bony abnormality. IMPRESSION: No acute or significant extracardiac abnormality. Electronically Signed: By: Charlett Nose M.D. On: 04/05/2018 10:57   US Abdomen Limited Ruq  Result Date: 04/04/2018 CLINICAL DATA:  Right upper quadrant pain EXAM: ULTRASOUND ABDOMEN LIMITED RIGHT UPPER QUADRANT COMPARISON:  11/17/2016 FINDINGS: Gallbladder: No gallstones or wall thickening visualized. No sonographic Murphy sign noted by sonographer. Common bile duct: Diameter: Up to 6.1 mm Liver: Cyst in the right lobe measuring 2 x 2.2 x 2 cm. Echogenicity within normal limits. Portal vein is patent on color Doppler imaging with normal direction of blood flow towards the liver. IMPRESSION: 1. Negative for acute cholecystitis. Common bile duct diameter upper limits of normal 2. Cyst in the right hepatic lobe Electronically Signed   By: Jasmine Pang M.D.   On: 04/04/2018 21:22    Microbiology: No results found for this or any previous visit (from the past  240 hour(s)).   Labs: CBC: Recent Labs  Lab 04/04/18 1229  WBC 5.7  NEUTROABS 3.1  HGB 14.5  HCT 41.2  MCV 89.2  PLT 297   Basic Metabolic Panel: Recent Labs  Lab 04/04/18 1229  NA 139  K 3.7  CL 101  CO2 29  GLUCOSE 71  BUN 7  CREATININE 0.63  CALCIUM 9.5  MG 2.2   Liver Function Tests: Recent Labs  Lab 04/04/18 1229  AST 19  ALT 16  ALKPHOS 52  BILITOT 0.5  PROT 7.3  ALBUMIN 3.9   No results for input(s): LIPASE, AMYLASE in the last 168 hours. No results for input(s): AMMONIA in the last 168 hours. Cardiac Enzymes: Recent Labs  Lab 04/04/18 1229 04/04/18 1904 04/05/18 0131 04/05/18 1114  TROPONINI <0.03 <0.03 <0.03 <0.03   BNP (last 3 results) No results for input(s): BNP in the last 8760 hours. CBG: No results for input(s): GLUCAP in the last 168 hours. Time spent: 35 minutes  Signed:  Lynden Oxford  [facility] 04/05/2018 , 6:08 PM

## 2018-04-05 NOTE — Progress Notes (Addendum)
Progress Note  Patient Name: Dawn Harrington Date of Encounter: 04/05/2018  Primary Cardiologist: Lance Muss, MD   Subjective   Feeling well. No chest pain, sob or palpitations.   Inpatient Medications    Scheduled Meds: . amLODipine  5 mg Oral Daily  . aspirin EC  81 mg Oral Daily  . cholecalciferol  2,000 Units Oral Daily  . enoxaparin (LOVENOX) injection  40 mg Subcutaneous Q24H  . pantoprazole  40 mg Oral Daily   Continuous Infusions:  PRN Meds: acetaminophen, ALPRAZolam, morphine injection, nitroGLYCERIN, ondansetron (ZOFRAN) IV   Vital Signs    Vitals:   04/04/18 1617 04/04/18 2157 04/05/18 0150 04/05/18 0554  BP:  (!) 128/50 111/61 (!) 118/53  Pulse:  70 60 63  Resp:  16 16 16   Temp:  98.5 F (36.9 C) 98.1 F (36.7 C) 98.2 F (36.8 C)  TempSrc:  Oral Oral Oral  SpO2:  99% 100% 100%  Weight: 66.2 kg     Height: 5\' 8"  (1.727 m)      No intake or output data in the 24 hours ending 04/05/18 0819 Filed Weights   04/04/18 1617  Weight: 66.2 kg    Telemetry    SR- Personally Reviewed  ECG    None - Personally Reviewed  Physical Exam   GEN: No acute distress.   Neck: No JVD Cardiac: RRR, no murmurs, rubs, or gallops.  Respiratory: Clear to auscultation bilaterally. GI: Soft, nontender, non-distended  MS: No edema; No deformity. Neuro:  Nonfocal  Psych: Normal affect   Labs    Chemistry Recent Labs  Lab 04/04/18 1229  NA 139  K 3.7  CL 101  CO2 29  GLUCOSE 71  BUN 7  CREATININE 0.63  CALCIUM 9.5  PROT 7.3  ALBUMIN 3.9  AST 19  ALT 16  ALKPHOS 52  BILITOT 0.5  GFRNONAA >60  GFRAA >60  ANIONGAP 9     Hematology Recent Labs  Lab 04/04/18 1229  WBC 5.7  RBC 4.62  HGB 14.5  HCT 41.2  MCV 89.2  MCH 31.4  MCHC 35.2  RDW 12.5  PLT 297    Cardiac Enzymes Recent Labs  Lab 04/04/18 1229 04/04/18 1904 04/05/18 0131  TROPONINI <0.03 <0.03 <0.03   No results for input(s): TROPIPOC in the last 168 hours.   BNPNo  results for input(s): BNP, PROBNP in the last 168 hours.   DDimer No results for input(s): DDIMER in the last 168 hours.   Radiology    Dg Chest 2 View  Result Date: 04/04/2018 CLINICAL DATA:  Chest pain EXAM: CHEST - 2 VIEW COMPARISON:  December 06, 2008 FINDINGS: The lungs are clear. The heart size and pulmonary vascularity are normal. No adenopathy. No pneumothorax. No bone lesions. IMPRESSION: No edema or consolidation. Electronically Signed   By: Bretta Bang III M.D.   On: 04/04/2018 11:39   US Abdomen Limited Ruq  Result Date: 04/04/2018 CLINICAL DATA:  Right upper quadrant pain EXAM: ULTRASOUND ABDOMEN LIMITED RIGHT UPPER QUADRANT COMPARISON:  11/17/2016 FINDINGS: Gallbladder: No gallstones or wall thickening visualized. No sonographic Murphy sign noted by sonographer. Common bile duct: Diameter: Up to 6.1 mm Liver: Cyst in the right lobe measuring 2 x 2.2 x 2 cm. Echogenicity within normal limits. Portal vein is patent on color Doppler imaging with normal direction of blood flow towards the liver. IMPRESSION: 1. Negative for acute cholecystitis. Common bile duct diameter upper limits of normal 2. Cyst in the right hepatic lobe  Electronically Signed   By: Jasmine Pang M.D.   On: 04/04/2018 21:22    Cardiac Studies   Study Conclusions  - Left ventricle: The cavity size was normal. Wall thickness was   normal. Systolic function was normal. The estimated ejection   fraction was in the range of 55% to 60%. Wall motion was normal;   there were no regional wall motion abnormalities. Left   ventricular diastolic function parameters were normal. - Aortic valve: There was no stenosis. - Mitral valve: There was mild regurgitation. - Left atrium: The atrium was mildly dilated. - Right ventricle: The cavity size was normal. Systolic function   was normal. - Tricuspid valve: Peak RV-RA gradient (S): 30 mm Hg. - Pulmonary arteries: PA peak pressure: 33 mm Hg (S). - Inferior vena cava: The  vessel was normal in size. The   respirophasic diameter changes were in the normal range (>= 50%),   consistent with normal central venous pressure.  Impressions:  - Normal LV size with EF 55-60%. Normal diastolic function. Normal   RV size and systolic function. Mild mitral regurgitation.  Patient Profile     Dawn Harrington is a 57 y.o. female with a hx of HTN, prior heart catheterization (intervention unknown, approximately 9-10 years ago), esophageal stricturer, and GERD presents for chest/epigastric/RUQ pain.   Assessment & Plan    1. Chest pain / epigastric pain radiating to RUQ - Patient is ruled out. EKG with TWI in leads V3 to V5. Echo with normal LVEF function. Pending coronary CT today.    For questions or updates, please contact [facility] Please consult [email or web address] for contact info under        Signed, Manson Passey, PA  04/05/2018, 8:19 AM    I have examined the patient and reviewed assessment and plan and discussed with patient.  Agree with above as stated.  Some atypical chest pain.  Also with some abdominal pain.  Will defer to IM for GI w/u.  KNown issues with GERD.  Right UQ pain reported yesterday.  Lance Muss

## 2018-04-05 NOTE — Progress Notes (Signed)
Patient transported on tele to CT for coronary via WC. No complaints at this time.

## 2018-04-06 LAB — HIV ANTIBODY (ROUTINE TESTING W REFLEX): HIV Screen 4th Generation wRfx: NONREACTIVE

## 2018-04-09 ENCOUNTER — Ambulatory Visit
Admission: RE | Admit: 2018-04-09 | Discharge: 2018-04-09 | Disposition: A | Discharge status: Home / Self Care | Payer: BLUE CROSS/BLUE SHIELD | Source: Ambulatory Visit | Attending: Gastroenterology | Admitting: Gastroenterology

## 2018-04-09 DIAGNOSIS — R1310 Dysphagia, unspecified: Secondary | ICD-10-CM

## 2018-04-09 DIAGNOSIS — R1319 Other dysphagia: Secondary | ICD-10-CM

## 2018-04-17 ENCOUNTER — Other Ambulatory Visit: Payer: Self-pay | Admitting: Gastroenterology

## 2018-04-17 DIAGNOSIS — K219 Gastro-esophageal reflux disease without esophagitis: Secondary | ICD-10-CM

## 2018-04-30 ENCOUNTER — Ambulatory Visit
Admission: RE | Admit: 2018-04-30 | Discharge: 2018-04-30 | Disposition: A | Discharge status: Home / Self Care | Payer: BLUE CROSS/BLUE SHIELD | Source: Ambulatory Visit | Attending: Gastroenterology | Admitting: Gastroenterology

## 2018-04-30 DIAGNOSIS — K219 Gastro-esophageal reflux disease without esophagitis: Secondary | ICD-10-CM

## 2021-07-10 ENCOUNTER — Other Ambulatory Visit: Payer: Self-pay

## 2021-07-10 ENCOUNTER — Emergency Department (HOSPITAL_BASED_OUTPATIENT_CLINIC_OR_DEPARTMENT_OTHER)
Admission: EM | Admit: 2021-07-10 | Discharge: 2021-07-10 | Disposition: A | Discharge status: Home / Self Care | Payer: No Typology Code available for payment source | Attending: Emergency Medicine | Admitting: Emergency Medicine

## 2021-07-10 ENCOUNTER — Emergency Department (HOSPITAL_BASED_OUTPATIENT_CLINIC_OR_DEPARTMENT_OTHER): Payer: No Typology Code available for payment source

## 2021-07-10 ENCOUNTER — Encounter (HOSPITAL_BASED_OUTPATIENT_CLINIC_OR_DEPARTMENT_OTHER): Payer: Self-pay | Admitting: Emergency Medicine

## 2021-07-10 DIAGNOSIS — M25511 Pain in right shoulder: Secondary | ICD-10-CM

## 2021-07-10 DIAGNOSIS — S0990XA Unspecified injury of head, initial encounter: Secondary | ICD-10-CM | POA: Insufficient documentation

## 2021-07-10 DIAGNOSIS — Z7982 Long term (current) use of aspirin: Secondary | ICD-10-CM | POA: Diagnosis not present

## 2021-07-10 DIAGNOSIS — I10 Essential (primary) hypertension: Secondary | ICD-10-CM | POA: Insufficient documentation

## 2021-07-10 DIAGNOSIS — M25561 Pain in right knee: Secondary | ICD-10-CM | POA: Diagnosis not present

## 2021-07-10 DIAGNOSIS — Z79899 Other long term (current) drug therapy: Secondary | ICD-10-CM | POA: Diagnosis not present

## 2021-07-10 DIAGNOSIS — S43101A Unspecified dislocation of right acromioclavicular joint, initial encounter: Secondary | ICD-10-CM | POA: Diagnosis not present

## 2021-07-10 DIAGNOSIS — S40911A Unspecified superficial injury of right shoulder, initial encounter: Secondary | ICD-10-CM | POA: Diagnosis present

## 2021-07-10 MED ORDER — KETOROLAC TROMETHAMINE 30 MG/ML IJ SOLN
30.0000 mg | Freq: Once | INTRAMUSCULAR | Status: AC
Start: 2021-07-10 — End: 2021-07-10
  Administered 2021-07-10: 30 mg via INTRAMUSCULAR
  Filled 2021-07-10: qty 1

## 2021-07-10 MED ORDER — METHOCARBAMOL 500 MG PO TABS
500.0000 mg | ORAL_TABLET | Freq: Once | ORAL | Status: AC
  Administered 2021-07-10: 500 mg via ORAL
  Filled 2021-07-10: qty 1

## 2021-07-10 MED ORDER — METHOCARBAMOL 500 MG PO TABS: 500.0000 mg | ORAL_TABLET | Freq: Two times a day (BID) | ORAL | 0 refills | Status: AC
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
## 2021-07-13 ENCOUNTER — Ambulatory Visit: Payer: BC Managed Care – PPO | Admitting: Family Medicine

## 2021-07-14 ENCOUNTER — Ambulatory Visit (INDEPENDENT_AMBULATORY_CARE_PROVIDER_SITE_OTHER): Payer: BC Managed Care – PPO | Admitting: Family Medicine

## 2021-07-14 ENCOUNTER — Encounter: Payer: Self-pay | Admitting: Family Medicine

## 2021-07-14 VITALS — BP 148/90 | Ht 68.0 in | Wt 145.0 lb

## 2021-07-14 DIAGNOSIS — S161XXA Strain of muscle, fascia and tendon at neck level, initial encounter: Secondary | ICD-10-CM

## 2021-07-14 DIAGNOSIS — S46011A Strain of muscle(s) and tendon(s) of the rotator cuff of right shoulder, initial encounter: Secondary | ICD-10-CM | POA: Diagnosis not present

## 2021-07-14 MED ORDER — MELOXICAM 7.5 MG PO TABS: 7.5000 mg | ORAL_TABLET | Freq: Two times a day (BID) | ORAL | 1 refills | Status: AC | PRN
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
## 2021-07-14 NOTE — Patient Instructions (Signed)
Nice to meet you Please use ice or heat  Please try the exercises  Please try the mobic for 5 days straight and then as needed  Please try physical therapy   Please send me a message in MyChart with any questions or updates.  Please see me back in 2 weeks.   --Dr. Jordan Likes

## 2021-07-15 ENCOUNTER — Encounter: Payer: Self-pay | Admitting: Physical Therapy

## 2021-07-15 ENCOUNTER — Other Ambulatory Visit: Payer: Self-pay

## 2021-07-15 ENCOUNTER — Ambulatory Visit: Payer: BC Managed Care – PPO | Attending: Family Medicine | Admitting: Physical Therapy

## 2021-07-15 DIAGNOSIS — S46011A Strain of muscle(s) and tendon(s) of the rotator cuff of right shoulder, initial encounter: Secondary | ICD-10-CM | POA: Diagnosis not present

## 2021-07-15 DIAGNOSIS — M25611 Stiffness of right shoulder, not elsewhere classified: Secondary | ICD-10-CM | POA: Diagnosis present

## 2021-07-15 DIAGNOSIS — S161XXA Strain of muscle, fascia and tendon at neck level, initial encounter: Secondary | ICD-10-CM | POA: Insufficient documentation

## 2021-07-15 DIAGNOSIS — M62838 Other muscle spasm: Secondary | ICD-10-CM | POA: Insufficient documentation

## 2021-07-15 DIAGNOSIS — M542 Cervicalgia: Secondary | ICD-10-CM | POA: Diagnosis present

## 2021-07-15 DIAGNOSIS — M25511 Pain in right shoulder: Secondary | ICD-10-CM | POA: Insufficient documentation

## 2021-07-15 DIAGNOSIS — M6281 Muscle weakness (generalized): Secondary | ICD-10-CM | POA: Diagnosis present
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
## 2021-07-15 NOTE — Patient Instructions (Signed)
Access Code: [email or web address] URL: [email or web address] Date: 07/15/2021 Prepared by: Raynelle Fanning  Exercises Seated Cervical Rotation AROM - 2 x daily - 7 x weekly - 1 sets - 5 reps - 5 sec hold Seated Cervical Sidebending AROM - 2 x daily - 7 x weekly - 1 sets - 5 reps - 5 sec hold Seated Cervical Retraction Protraction AROM - 2 x daily - 7 x weekly - 1 sets - 10 reps - 5 sec hold Seated Scapular Retraction - 4 x daily - 7 x weekly - 1 sets - 10 reps - 2-3 sec hold  Patient Education Forward Head Posture Office Posture

## 2021-07-28 ENCOUNTER — Other Ambulatory Visit: Payer: Self-pay

## 2021-07-28 ENCOUNTER — Ambulatory Visit: Payer: BC Managed Care – PPO

## 2021-07-28 DIAGNOSIS — M25511 Pain in right shoulder: Secondary | ICD-10-CM | POA: Diagnosis not present

## 2021-07-28 DIAGNOSIS — M6281 Muscle weakness (generalized): Secondary | ICD-10-CM

## 2021-07-28 DIAGNOSIS — M62838 Other muscle spasm: Secondary | ICD-10-CM

## 2021-07-28 DIAGNOSIS — M25611 Stiffness of right shoulder, not elsewhere classified: Secondary | ICD-10-CM

## 2021-07-28 DIAGNOSIS — M542 Cervicalgia: Secondary | ICD-10-CM

## 2021-07-28 NOTE — Patient Instructions (Signed)
Access Code: [email or web address] URL: [email or web address] Date: 07/28/2021 Prepared by: Clarene Essex  Exercises Seated Cervical Rotation AROM - 2 x daily - 7 x weekly - 1 sets - 5 reps - 5 sec hold Seated Cervical Sidebending AROM - 2 x daily - 7 x weekly - 1 sets - 5 reps - 5 sec hold Seated Cervical Retraction Protraction AROM - 2 x daily - 7 x weekly - 1 sets - 10 reps - 5 sec hold Seated Scapular Retraction - 4 x daily - 7 x weekly - 1 sets - 10 reps - 2-3 sec hold Seated Shoulder Horizontal Abduction with Resistance - Palms Down - 1 x daily - 7 x weekly - 3 sets - 10 reps Shoulder External Rotation and Scapular Retraction with Resistance - 1 x daily - 7 x weekly - 3 sets - 10 reps

## 2021-07-28 NOTE — Therapy (Signed)
[facility] [place] [address]  [address] [place], [place], [address] Phone: [phone]   Fax:  [phone]  Physical Therapy Treatment  Patient Details  Name: Dawn MulberryMary Harrington MRN: [id number] Date of Birth: [birth year] Referring Provider (PT): Clare GandyJeremy Schmitz MD   Encounter Date: 07/28/2021   PT End of Session - 07/28/21 1159     Visit Number 2    Date for PT Re-Evaluation 08/26/21    Authorization Type BC[employer]    PT Start Time 1026   pt late   PT Stop Time 1057    PT Time Calculation (min) 31 min    Activity Tolerance Patient tolerated treatment well    Behavior During Therapy [facility] for tasks assessed/performed             Past Medical History:  Diagnosis Date   Abnormal menstrual cycle 2006   Condyloma 03/2005   Dysrhythmia 2010   syncopal event-vf witnessed-shocked-catheterization   GERD (gastroesophageal reflux disease)    rarely   H/O dysmenorrhea 2005   Hip pain 2008   Hx of metrorrhagia 2005   Hypertension    Norvasc    Past Surgical History:  Procedure Laterality Date   APPENDECTOMY     heart cathetherization  2010   SHOULDER SURGERY  2012   [place]   TONSILLECTOMY      There were no vitals filed for this visit.   Subjective Assessment - 07/28/21 1029     Subjective I'm having a little pain right now, it gets worse when I do work.    Pertinent History HTN    Diagnostic tests CT/XR from ER    Currently in Pain? Yes    Pain Score 5     Pain Location Shoulder    Pain Orientation Right    Pain Descriptors / Indicators Aching    Pain Type Acute pain                               [facility] Adult PT Treatment/Exercise - 07/28/21 0001       Exercises   Exercises Neck      Neck Exercises: Machines for Strengthening   UBE (Upper Arm Bike) L1.0 4min      Neck Exercises: Seated   Neck Retraction 10 reps    Cervical Rotation Both;10 reps    Cervical Rotation Limitations cues to avoid  pain    Lateral Flexion Both;10 reps    Other Seated Exercise scapular retraction 10x; B ER and reverse fly with YTB 10x each   Other Seated Exercise green Pball flexion rollouts 10x2      Neck Exercises: Stretches   Chest Stretch 2 reps;20 seconds    Chest Stretch Limitations doorway pec stretch                     PT Education - 07/28/21 1056     Education Details HEP update, edu on posture    Person(s) Educated Patient    Methods Explanation;Demonstration    Comprehension Verbalized understanding;Returned demonstration              PT Short Term Goals - 07/28/21 1204       PT SHORT TERM GOAL #1   Title Ind with initial HEP    Time 2    Period Weeks    Status On-going    Target Date 07/29/21  PT SHORT TERM GOAL #2   Title Patient to report decreased neck and right shoulder pain by 50% with ADLS    Time 3    Period Weeks    Status On-going      PT SHORT TERM GOAL #3   Title Patient to understand and demonstrate the importance of correct posture in decreasing and preventing further pain.    Time 6    Period Weeks    Status On-going               PT Long Term Goals - 07/28/21 1204       PT LONG TERM GOAL #1   Title ind with advanced HEP    Time 6    Period Weeks    Status On-going    Target Date 08/26/21      PT LONG TERM GOAL #2   Title Patient to demonstrate full functional neck ROM with 1/10 pain or less    Time 6    Period Weeks    Status On-going      PT LONG TERM GOAL #3   Title Patient to demo full functional left shoulder ROM to allow her to perform OH ADLS and don/doff clothing.    Time 6    Period Weeks    Status On-going      PT LONG TERM GOAL #4   Title Patient able to perform ADLS with >= 4+/5 R UE strength and pain at 1/10 or less    Time 6    Period Weeks    Status On-going      PT LONG TERM GOAL #5   Title Patient able to sleep without waking from pain    Time 6    Status On-going                    Plan - 07/28/21 1202     Clinical Impression Statement Pt was guarded with just about all movements. Progressed HEP to include more strengthening for the periscap muscles. Cues needed to avoid painful ROM and to improve posture with exercises to decrease pain. She will need more focus on posterior shoulder strengthening and pec stretching to improve resting posture.    PT Frequency 2x / week    PT Duration 6 weeks    PT Treatment/Interventions ADLs/Self Care Home Management;Cryotherapy;Electrical Stimulation;Iontophoresis 4mg /ml Dexamethasone;Moist Heat;Neuromuscular re-education;Therapeutic exercise;Therapeutic activities;Patient/family education;Manual techniques;Dry needling;Taping;Spinal Manipulations;Joint Manipulations    PT Next Visit Plan Review HEP progress for shoulder as tolerated; QL release right; STM to right biceps/triceps/neck/scapular muscles prn; cont estim prn    PT Home Exercise Plan    Consulted and Agree with Plan of Care Patient             Patient will benefit from skilled therapeutic intervention in order to improve the following deficits and impairments:  Decreased range of motion, Pain, Impaired UE functional use, Increased muscle spasms, Impaired flexibility, Postural dysfunction, Decreased strength  Visit Diagnosis: Acute pain of right shoulder  Stiffness of right shoulder, not elsewhere classified  Cervicalgia  Other muscle spasm  Muscle weakness (generalized)     Problem List Patient Active Problem List   Diagnosis Date Noted   Rotator cuff strain, right, initial encounter 07/14/2021   Cervical strain 07/14/2021   Chest pain 04/04/2018   Hypertension 04/04/2018   GERD (gastroesophageal reflux disease) 04/04/2018    06/04/2018, PTA 07/28/2021, 12:05 PM  [facility] [place] [address]  [address] [place], [place], [address] Phone: [phone]   Fax:   [phone]  Name: Dawn Harrington MRN: [id number] Date of Birth: 05-Apr-1961

## 2021-08-02 ENCOUNTER — Other Ambulatory Visit: Payer: Self-pay

## 2021-08-02 ENCOUNTER — Ambulatory Visit: Payer: BC Managed Care – PPO | Admitting: Physical Therapy

## 2021-08-02 ENCOUNTER — Encounter: Payer: Self-pay | Admitting: Physical Therapy

## 2021-08-02 DIAGNOSIS — M25511 Pain in right shoulder: Secondary | ICD-10-CM

## 2021-08-02 DIAGNOSIS — M542 Cervicalgia: Secondary | ICD-10-CM

## 2021-08-02 DIAGNOSIS — M62838 Other muscle spasm: Secondary | ICD-10-CM

## 2021-08-02 DIAGNOSIS — M6281 Muscle weakness (generalized): Secondary | ICD-10-CM

## 2021-08-02 DIAGNOSIS — M25611 Stiffness of right shoulder, not elsewhere classified: Secondary | ICD-10-CM

## 2021-08-02 NOTE — Therapy (Signed)
[facility] [place] [address]  [address] [place], [place], [address] Phone: [phone]   Fax:  [phone]  Physical Therapy Treatment  Patient Details  Name: Dawn MulberryMary Harrington MRN: [id number] Date of Birth: 1961-03-27 Referring Provider (PT): Clare GandyJeremy Schmitz MD   Encounter Date: 08/02/2021   PT End of Session - 08/02/21 0934     Visit Number 3    Date for PT Re-Evaluation 08/26/21    Authorization Type BCBS    PT Start Time 0934    PT Stop Time 1033    PT Time Calculation (min) 59 min    Activity Tolerance Patient tolerated treatment well    Behavior During Therapy [facility] for tasks assessed/performed             Past Medical History:  Diagnosis Date   Abnormal menstrual cycle 2006   Condyloma 03/2005   Dysrhythmia 2010   syncopal event-vf witnessed-shocked-catheterization   GERD (gastroesophageal reflux disease)    rarely   H/O dysmenorrhea 2005   Hip pain 2008   Hx of metrorrhagia 2005   Hypertension    Norvasc    Past Surgical History:  Procedure Laterality Date   APPENDECTOMY     heart cathetherization  2010   SHOULDER SURGERY  2012   [place]   TONSILLECTOMY      There were no vitals filed for this visit.   Subjective Assessment - 08/02/21 0937     Subjective Pt reports mild pain that still increases with certain activities.    Patient is accompained by: Interpreter   video   Pertinent History HTN    Diagnostic tests CT/XR from ER    Pain Score 5    4.5-5/10   Pain Location Shoulder    Pain Orientation Right    Pain Descriptors / Indicators Aching    Pain Type Acute pain    Pain Frequency Constant   varies in intensity                              [facility] Adult PT Treatment/Exercise - 08/02/21 0934       Exercises   Exercises Neck      Neck Exercises: Machines for Strengthening   UBE (Upper Arm Bike) L2.0 x 6 min (3' each fwd & back)      Neck Exercises: Sidelying   Other  Sidelying Exercise R open book stretch/slide with elbow flexed 10 x 5 "    Other Sidelying Exercise Scap retraction + shoulder ER 10 x 3"      Neck Exercises: Stretches   Upper Trapezius Stretch Right;2 reps;30 seconds    Levator Stretch Right;2 reps;30 seconds      Lumbar Exercises: Stretches   Quadruped Mid Back Stretch 3 reps;30 seconds    Quadruped Mid Back Stretch Limitations 3-way prayer stretch/child's pose      Moist Heat Therapy   Number Minutes Moist Heat 15 Minutes    Moist Heat Location Shoulder;Cervical   right     Manual Therapy   Manual Therapy Soft tissue mobilization;Myofascial release;Passive ROM    Soft tissue mobilization STM/DTM to R UT, LS, teres group, lats and QL    Myofascial Release manual TPR to R UT, teres group, lats and Q; pin and stretch to R UT, lats and QL    Passive ROM manual R QL stretch in L sidelying  PT Short Term Goals - 08/02/21 1221       PT SHORT TERM GOAL #1   Title Ind with initial HEP    Time 2    Period Weeks    Status On-going    Target Date 07/29/21      PT SHORT TERM GOAL #2   Title Patient to report decreased neck and right shoulder pain by 50% with ADLS    Time 3    Period Weeks    Status On-going    Target Date 08/05/21      PT SHORT TERM GOAL #3   Title Patient to understand and demonstrate the importance of correct posture in decreasing and preventing further pain.    Time 6    Period Weeks    Status On-going    Target Date 08/26/21               PT Long Term Goals - 08/02/21 1222       PT LONG TERM GOAL #1   Title ind with advanced HEP    Time 6    Period Weeks    Status On-going    Target Date 08/26/21      PT LONG TERM GOAL #2   Title Patient to demonstrate full functional neck ROM with 1/10 pain or less    Time 6    Period Weeks    Status On-going    Target Date 08/26/21      PT LONG TERM GOAL #3   Title Patient to demo full functional left shoulder ROM  to allow her to perform OH ADLS and don/doff clothing.    Time 6    Period Weeks    Status On-going    Target Date 08/26/21      PT LONG TERM GOAL #4   Title Patient able to perform ADLS with >= 4+/5 R UE strength and pain at 1/10 or less    Time 6    Period Weeks    Status On-going    Target Date 08/26/21      PT LONG TERM GOAL #5   Title Patient able to sleep without waking from pain    Time 6    Status On-going    Target Date 08/26/21                   Plan - 08/02/21 1033     Clinical Impression Statement Dawn Harrington reports ongoing pain primarily in R upper shoulder today which varies in intensity according to activity. Increased muscle tension evident in R UT, LS, teres group, lats and QL - all addressed with MT and relevant stretching. Some limited tolerance for pressure during MT, esp with manual TPR in upper shoulder, but pt noting decreased pain and muscle soreness to 3/10 by end of session. Session concluding with moist heat to R lateral neck and upper shoulder to promote further pain reduction and muscle relaxation.    Rehab Potential Excellent    PT Frequency 2x / week    PT Duration 6 weeks    PT Treatment/Interventions ADLs/Self Care Home Management;Cryotherapy;Electrical Stimulation;Iontophoresis 4mg /ml Dexamethasone;Moist Heat;Neuromuscular re-education;Therapeutic exercise;Therapeutic activities;Patient/family education;Manual techniques;Dry needling;Taping;Spinal Manipulations;Joint Manipulations    PT Next Visit Plan Review HEP progress for shoulder as tolerated; QL release right; STM to right biceps/triceps/neck/scapular muscles prn; cont estim prn    PT Home Exercise Plan    Consulted and Agree with Plan of Care Patient  Patient will benefit from skilled therapeutic intervention in order to improve the following deficits and impairments:  Decreased range of motion, Pain, Impaired UE functional use, Increased muscle spasms, Impaired  flexibility, Postural dysfunction, Decreased strength  Visit Diagnosis: Acute pain of right shoulder  Stiffness of right shoulder, not elsewhere classified  Cervicalgia  Other muscle spasm  Muscle weakness (generalized)     Problem List Patient Active Problem List   Diagnosis Date Noted   Rotator cuff strain, right, initial encounter 07/14/2021   Cervical strain 07/14/2021   Chest pain 04/04/2018   Hypertension 04/04/2018   GERD (gastroesophageal reflux disease) 04/04/2018    Marry Guan, PT 08/02/2021, 12:29 PM  [facility] [address]  [address] [place], [place], [address] Phone: [phone]   Fax:  [phone]  Name: Dawn Harrington MRN: [id number] Date of Birth: 02-Aug-1960

## 2021-08-05 ENCOUNTER — Ambulatory Visit: Payer: BC Managed Care – PPO | Attending: Family Medicine

## 2021-08-05 ENCOUNTER — Other Ambulatory Visit: Payer: Self-pay

## 2021-08-05 DIAGNOSIS — M6281 Muscle weakness (generalized): Secondary | ICD-10-CM | POA: Insufficient documentation

## 2021-08-05 DIAGNOSIS — M25611 Stiffness of right shoulder, not elsewhere classified: Secondary | ICD-10-CM | POA: Diagnosis present

## 2021-08-05 DIAGNOSIS — M25511 Pain in right shoulder: Secondary | ICD-10-CM | POA: Insufficient documentation

## 2021-08-05 DIAGNOSIS — M542 Cervicalgia: Secondary | ICD-10-CM | POA: Diagnosis present

## 2021-08-05 DIAGNOSIS — M62838 Other muscle spasm: Secondary | ICD-10-CM | POA: Diagnosis present

## 2021-08-05 NOTE — Therapy (Signed)
[facility] [address]  [place], [place], [address] Phone: [phone]   Fax:  [phone]  Physical Therapy Treatment  Patient Details  Name: Dawn Harrington MRN: [id number] Date of Birth: 1961/01/20 Referring Provider (PT): Clearance Coots MD   Encounter Date: 08/05/2021   PT End of Session - 08/05/21 1017     Visit Number 4    Date for PT Re-Evaluation 08/26/21    Authorization Type BCBS    PT Start Time 0933    PT Stop Time 1013    PT Time Calculation (min) 40 min    Activity Tolerance Patient tolerated treatment well    Behavior During Therapy [facility] for tasks assessed/performed             Past Medical History:  Diagnosis Date   Abnormal menstrual cycle 2006   Condyloma 03/2005   Dysrhythmia 2010   syncopal event-vf witnessed-shocked-catheterization   GERD (gastroesophageal reflux disease)    rarely   H/O dysmenorrhea 2005   Hip pain 2008   Hx of metrorrhagia 2005   Hypertension    Norvasc    Past Surgical History:  Procedure Laterality Date   APPENDECTOMY     heart cathetherization  2010   SHOULDER SURGERY  2012   [facility]   TONSILLECTOMY      There were no vitals filed for this visit.   Subjective Assessment - 08/05/21 0934     Subjective "I cleaned and mopped a house yesterday, so today I hurt a little more."    Patient is accompained by: Interpreter    Pertinent History HTN    Diagnostic tests CT/XR from ER    Currently in Pain? Yes    Pain Score 7     Pain Location Shoulder    Pain Orientation Right    Pain Descriptors / Indicators Aching    Pain Type Acute pain                               [facility] Adult PT Treatment/Exercise - 08/05/21 0001       Exercises   Exercises Neck      Neck Exercises: Machines for Strengthening   UBE (Upper Arm Bike) L2.0 x 6 min (3' each fwd & back)      Neck Exercises: Theraband   Shoulder External Rotation 20 reps;Red     Shoulder External Rotation Limitations 1 set with yellow TB, 1 set with red TB    Horizontal ABduction 20 reps;Red   1 set yellow TB, 1 set red TB, reminders to avoid dropping arms or shrugging shoulders     Neck Exercises: Seated   Other Seated Exercise thoracic extension 10x3", thoracic rotation 10x3"      Neck Exercises: Stretches   Upper Trapezius Stretch Right;Left;30 seconds;2 reps    Levator Stretch Right;Left;2 reps;30 seconds    Chest Stretch 2 reps;30 seconds    Chest Stretch Limitations doorway pec stretch      Shoulder Exercises: Standing   External Rotation Strengthening;Right;5 reps;Theraband    Theraband Level (Shoulder External Rotation) Level 2 (Red)    External Rotation Limitations 5x isometric step out    Internal Rotation Strengthening;Right;5 reps;Theraband    Theraband Level (Shoulder Internal Rotation) Level 2 (Red)    Internal Rotation Limitations 5x isometric step out  PT Short Term Goals - 08/05/21 1023       PT SHORT TERM GOAL #1   Title Ind with initial HEP    Time 2    Period Weeks    Status Partially Met   pt compliant but needing lots of cues for proper technique   Target Date 07/29/21      PT SHORT TERM GOAL #2   Title Patient to report decreased neck and right shoulder pain by 50% with ADLS    Time 3    Period Weeks    Status On-going   pt had increased pain today from house cleaning yesterday, should reassess next visit   Target Date 08/05/21      PT SHORT TERM GOAL #3   Title Patient to understand and demonstrate the importance of correct posture in decreasing and preventing further pain.    Time 6    Period Weeks    Status On-going    Target Date 08/26/21               PT Long Term Goals - 08/02/21 1222       PT LONG TERM GOAL #1   Title ind with advanced HEP    Time 6    Period Weeks    Status On-going    Target Date 08/26/21      PT LONG TERM GOAL #2   Title Patient to  demonstrate full functional neck ROM with 1/10 pain or less    Time 6    Period Weeks    Status On-going    Target Date 08/26/21      PT LONG TERM GOAL #3   Title Patient to demo full functional left shoulder ROM to allow her to perform OH ADLS and don/doff clothing.    Time 6    Period Weeks    Status On-going    Target Date 08/26/21      PT LONG TERM GOAL #4   Title Patient able to perform ADLS with >= 4+/5 R UE strength and pain at 1/10 or less    Time 6    Period Weeks    Status On-going    Target Date 08/26/21      PT LONG TERM GOAL #5   Title Patient able to sleep without waking from pain    Time 6    Status On-going    Target Date 08/26/21                   Plan - 08/05/21 1018     Clinical Impression Statement Pt noted increased pain today from cleaning the house yesterday. Needed a lot of cues for controlled motion with the horiz ABD and ER. She had a tendency to shrug and drop arms with the horiz ABD, cues given to avoid these compensations. Added some RTC strengthening today and progressed HEP with red TB. She has been doing her exercises at home but needs cues to target the correct muscles so STG partially met.    PT Frequency 2x / week    PT Duration 6 weeks    PT Treatment/Interventions ADLs/Self Care Home Management;Cryotherapy;Electrical Stimulation;Iontophoresis 4mg /ml Dexamethasone;Moist Heat;Neuromuscular re-education;Therapeutic exercise;Therapeutic activities;Patient/family education;Manual techniques;Dry needling;Taping;Spinal Manipulations;Joint Manipulations    PT Next Visit Plan thoracic mobility, periscap strengthening; QL release right; STM to right biceps/triceps/neck/scapular muscles prn; cont estim prn    PT Home Exercise Plan [email or web address]    Consulted and Agree with Plan of Care Patient  Patient will benefit from skilled therapeutic intervention in order to improve the following deficits and impairments:  Decreased range of  motion, Pain, Impaired UE functional use, Increased muscle spasms, Impaired flexibility, Postural dysfunction, Decreased strength  Visit Diagnosis: Acute pain of right shoulder  Stiffness of right shoulder, not elsewhere classified  Cervicalgia  Other muscle spasm  Muscle weakness (generalized)     Problem List Patient Active Problem List   Diagnosis Date Noted   Rotator cuff strain, right, initial encounter 07/14/2021   Cervical strain 07/14/2021   Chest pain 04/04/2018   Hypertension 04/04/2018   GERD (gastroesophageal reflux disease) 04/04/2018    Artist Pais, PTA 08/05/2021, 10:35 AM  [facility] [address]  [place], [place], [address] Phone: [phone]   Fax:  [phone]  Name: Dawn Harrington MRN: [id number] Date of Birth: 08/01/1960

## 2021-08-09 ENCOUNTER — Other Ambulatory Visit: Payer: Self-pay

## 2021-08-09 ENCOUNTER — Ambulatory Visit: Payer: BC Managed Care – PPO | Admitting: Physical Therapy

## 2021-08-09 ENCOUNTER — Encounter: Payer: Self-pay | Admitting: Physical Therapy

## 2021-08-09 DIAGNOSIS — M25511 Pain in right shoulder: Secondary | ICD-10-CM

## 2021-08-09 DIAGNOSIS — M6281 Muscle weakness (generalized): Secondary | ICD-10-CM

## 2021-08-09 DIAGNOSIS — M25611 Stiffness of right shoulder, not elsewhere classified: Secondary | ICD-10-CM

## 2021-08-09 DIAGNOSIS — M542 Cervicalgia: Secondary | ICD-10-CM

## 2021-08-09 DIAGNOSIS — M62838 Other muscle spasm: Secondary | ICD-10-CM

## 2021-08-09 NOTE — Therapy (Signed)
[facility] [address]  [place], [place], [address] Phone: [phone]   Fax:  [phone]  Physical Therapy Treatment  Patient Details  Name: Aryana Wonnacott MRN: [id number] Date of Birth: [birth year] Referring Provider (PT): Clearance Coots MD   Encounter Date: 08/09/2021   PT End of Session - 08/09/21 0932     Visit Number 5    Date for PT Re-Evaluation 08/26/21    Authorization Type BCBS    PT Start Time 0932    PT Stop Time 9371    PT Time Calculation (min) 49 min    Activity Tolerance Patient tolerated treatment well    Behavior During Therapy [facility] for tasks assessed/performed             Past Medical History:  Diagnosis Date   Abnormal menstrual cycle 2006   Condyloma 03/2005   Dysrhythmia 2010   syncopal event-vf witnessed-shocked-catheterization   GERD (gastroesophageal reflux disease)    rarely   H/O dysmenorrhea 2005   Hip pain 2008   Hx of metrorrhagia 2005   Hypertension    Norvasc    Past Surgical History:  Procedure Laterality Date   APPENDECTOMY     heart cathetherization  2010   SHOULDER SURGERY  2012   [facility]   TONSILLECTOMY      There were no vitals filed for this visit.   Subjective Assessment - 08/09/21 0936     Subjective Pt reports last night was a lot of pain.    Patient is accompained by: Interpreter    Pertinent History HTN    Diagnostic tests CT/XR from ER    Currently in Pain? Yes    Pain Score 6     Pain Location Shoulder    Pain Orientation Right    Pain Descriptors / Indicators Tightness;Sharp    Pain Type Acute pain    Pain Radiating Towards up into R side of neck down R arm    Pain Frequency Constant                               [facility] Adult PT Treatment/Exercise - 08/09/21 0932       Neck Exercises: Machines for Strengthening   UBE (Upper Arm Bike) L2.0 x 6 min (3' each fwd & back)      Neck Exercises: Seated   Other Seated  Exercise Scap retraction & depression 10 x 5"      Neck Exercises: Stretches   Upper Trapezius Stretch Right;2 reps;30 seconds    Levator Stretch Right;2 reps;30 seconds      Shoulder Exercises: Standing   Extension Both;10 reps;Strengthening;Theraband    Theraband Level (Shoulder Extension) Level 2 (Red)    Extension Limitations cues for scap retraction & depression    Row Both;10 reps;Strengthening;Theraband   2 sets   Theraband Level (Shoulder Row) Level 2 (Red)    Row Limitations cues for scap retraction; 2nd set with slight shoulder ER for increased scapular engagement      Manual Therapy   Manual Therapy Soft tissue mobilization;Myofascial release    Manual therapy comments skilled palpation and monitoring of soft tissue during DN    Soft tissue mobilization STM/DTM to R UT, LS, rhomboids, teres group, lats, pecs and biceps    Myofascial Release manual TPR to R UT, LS, rhomboids, teres group, lats, pecs and biceps; pin and stretch to R UT, lats  and biceps              Trigger Point Dry Needling - 08/09/21 0932     Consent Given? Yes    Education Handout Provided Yes    Muscles Treated Head and Neck Upper trapezius;Levator scapulae    Muscles Treated Upper Quadrant Pectoralis major;Pectoralis minor;Rhomboids;Deltoid;Latissimus dorsi;Teres major;Teres minor;Biceps    Dry Needling Comments Right    Upper Trapezius Response Twitch reponse elicited;Palpable increased muscle length    Levator Scapulae Response Twitch response elicited;Palpable increased muscle length    Pectoralis Major Response Twitch response elicited;Palpable increased muscle length    Pectoralis Minor Response Twitch response elicited;Palpable increased muscle length    Rhomboids Response Twitch response elicited;Palpable increased muscle length    Deltoid Response Twitch response elicited;Palpable increased muscle length   anterior & lateral   Latissimus dorsi Response Twitch response elicited;Palpable  increased muscle length    Teres major Response Twitch response elicited;Palpable increased muscle length    Teres minor Response Twitch response elicited;Palpable increased muscle length    Biceps Response Twitch response elicited;Palpable increased muscle length                   PT Education - 08/09/21 0941     Education Details HEP update - scap strengthening progression; DN rational, procedure, outcomes, potential side effects, and recommended post-treatment exercises/activity    Person(s) Educated Patient    Methods Explanation;Demonstration;Verbal cues;Tactile cues;Handout    Comprehension Verbalized understanding;Verbal cues required;Tactile cues required;Returned demonstration;Need further instruction              PT Short Term Goals - 08/09/21 0940       PT SHORT TERM GOAL #1   Title Ind with initial HEP    Status Partially Met   08/05/21 - pt compliant but needing lots of cues for proper technique   Target Date 07/29/21      PT SHORT TERM GOAL #2   Title Patient to report decreased neck and right shoulder pain by 50% with ADLS    Status On-going    Target Date 08/05/21      PT SHORT TERM GOAL #3   Title Patient to understand and demonstrate the importance of correct posture in decreasing and preventing further pain.    Status On-going    Target Date 08/26/21               PT Long Term Goals - 08/09/21 0941       PT LONG TERM GOAL #1   Title ind with advanced HEP    Status On-going    Target Date 08/26/21      PT LONG TERM GOAL #2   Title Patient to demonstrate full functional neck ROM with 1/10 pain or less    Status On-going    Target Date 08/26/21      PT LONG TERM GOAL #3   Title Patient to demo full functional left shoulder ROM to allow her to perform OH ADLS and don/doff clothing.    Status On-going    Target Date 08/26/21      PT LONG TERM GOAL #4   Title Patient able to perform ADLS with >= 4+/5 R UE strength and pain at 1/10 or  less    Status On-going    Target Date 08/26/21      PT LONG TERM GOAL #5   Title Patient able to sleep without waking from pain    Status On-going  Target Date 08/26/21                   Plan - 08/09/21 0942     Clinical Impression Statement Tahisha reports increased pain last night and notes pain essentially unchanged overall. She persists with forward head and rounded shoulder posture with increased muscle tension and TTP throughout R lateral neck and shoulder complex with radicular pain into R UE. Given persistent increased muscle tension, discussed trial of DN to promote normalization of muscle tension. After explanation of DN rational, procedures, outcomes and potential side effects, including precautions with DN over the lung fields, patient verbalized consent to DN treatment in conjunction with manual STM/DTM and TPR to reduce ttp/muscle tension. Muscles treated include R UT, LS, rhomboids, teres group, lats, pecs and biceps. DN produced normal response with good twitches elicited resulting in palpable reduction in pain/ttp and muscle tension. Pt educated to expect mild to moderate muscle soreness for up to 24-48 hrs and instructed to continue prescribed home exercise program and current activity level with pt verbalizing understanding of theses instructions. Reinforced further muscle relaxation and improved posture with stretching and scapular activation/strengthening with good tolerance - HEP updated accordingly.    PT Frequency 2x / week    PT Duration 6 weeks    PT Treatment/Interventions ADLs/Self Care Home Management;Cryotherapy;Electrical Stimulation;Iontophoresis 28m/ml Dexamethasone;Moist Heat;Neuromuscular re-education;Therapeutic exercise;Therapeutic activities;Patient/family education;Manual techniques;Dry needling;Taping;Spinal Manipulations;Joint Manipulations    PT Next Visit Plan assess response to DN; thoracic mobility; periscap strengthening; QL release right; STM  +/- DN as indicated and benefit noted to right biceps/triceps/neck/scapular muscles prn; cont estim prn    PT Home Exercise Plan [email or web address]   Consulted and Agree with Plan of Care Patient             Patient will benefit from skilled therapeutic intervention in order to improve the following deficits and impairments:  Decreased range of motion, Pain, Impaired UE functional use, Increased muscle spasms, Impaired flexibility, Postural dysfunction, Decreased strength  Visit Diagnosis: Acute pain of right shoulder  Stiffness of right shoulder, not elsewhere classified  Cervicalgia  Other muscle spasm  Muscle weakness (generalized)     Problem List Patient Active Problem List   Diagnosis Date Noted   Rotator cuff strain, right, initial encounter 07/14/2021   Cervical strain 07/14/2021   Chest pain 04/04/2018   Hypertension 04/04/2018   GERD (gastroesophageal reflux disease) 04/04/2018    JPercival Spanish PT 08/09/2021, 10:37 AM  [facility] [place] [address]Phone: [phone]  Fax:  [phone] Name: MMelida NorthingtonMRN: [id number] of Birth: 702-03-1961

## 2021-08-09 NOTE — Patient Instructions (Signed)
° °  Access Code: [email or web address] URL: [email or web address] Date: 08/09/2021 Prepared by: Glenetta Hew  Exercises Seated Cervical Rotation AROM - 2 x daily - 7 x weekly - 1 sets - 5 reps - 5 sec hold Seated Cervical Sidebending AROM - 2 x daily - 7 x weekly - 1 sets - 5 reps - 5 sec hold Seated Cervical Retraction Protraction AROM - 2 x daily - 7 x weekly - 1 sets - 10 reps - 5 sec hold Seated Scapular Retraction - 4 x daily - 7 x weekly - 1 sets - 10 reps - 2-3 sec hold Seated Shoulder Horizontal Abduction with Resistance - Palms Down - 1 x daily - 7 x weekly - 3 sets - 10 reps Shoulder External Rotation and Scapular Retraction with Resistance - 1 x daily - 7 x weekly - 3 sets - 10 reps Standing Bilateral Low Shoulder Row with Anchored Resistance - 1 x daily - 7 x weekly - 2 sets - 10 reps - 5 sec hold Scapular Retraction with Resistance Advanced - 1 x daily - 7 x weekly - 2 sets - 10 reps - 5 sec hold  Patient Education Trigger Point Dry Needling

## 2021-08-12 ENCOUNTER — Encounter: Payer: Self-pay | Admitting: Physical Therapy

## 2021-08-12 ENCOUNTER — Ambulatory Visit: Payer: BC Managed Care – PPO | Admitting: Physical Therapy

## 2021-08-12 ENCOUNTER — Other Ambulatory Visit: Payer: Self-pay

## 2021-08-12 DIAGNOSIS — M6281 Muscle weakness (generalized): Secondary | ICD-10-CM

## 2021-08-12 DIAGNOSIS — M25511 Pain in right shoulder: Secondary | ICD-10-CM

## 2021-08-12 DIAGNOSIS — M25611 Stiffness of right shoulder, not elsewhere classified: Secondary | ICD-10-CM

## 2021-08-12 DIAGNOSIS — M542 Cervicalgia: Secondary | ICD-10-CM

## 2021-08-12 DIAGNOSIS — M62838 Other muscle spasm: Secondary | ICD-10-CM

## 2021-08-12 NOTE — Therapy (Signed)
[facility] [address]  [place], [place], [address] Phone: [phone]   Fax:  [phone]  Physical Therapy Treatment  Patient Details  Name: Dawn Harrington MRN: [id number] Date of Birth: 1961-04-06 Referring Provider (PT): Clearance Coots MD   Encounter Date: 08/12/2021   PT End of Session - 08/12/21 1102     Visit Number 6    Date for PT Re-Evaluation 08/26/21    Authorization Type BCBS    PT Start Time 1102    PT Stop Time 8280    PT Time Calculation (min) 44 min    Activity Tolerance Patient tolerated treatment well    Behavior During Therapy [facility] for tasks assessed/performed             Past Medical History:  Diagnosis Date   Abnormal menstrual cycle 2006   Condyloma 03/2005   Dysrhythmia 2010   syncopal event-vf witnessed-shocked-catheterization   GERD (gastroesophageal reflux disease)    rarely   H/O dysmenorrhea 2005   Hip pain 2008   Hx of metrorrhagia 2005   Hypertension    Norvasc    Past Surgical History:  Procedure Laterality Date   APPENDECTOMY     heart cathetherization  2010   SHOULDER SURGERY  2012   [facility]   TONSILLECTOMY      There were no vitals filed for this visit.   Subjective Assessment - 08/12/21 1106     Subjective Pt feels like the DN helped with pain now more intermittent.  Pt reports she would like to start working out at the gym before she goes back to work.    Patient is accompained by: Interpreter    Pertinent History HTN    Diagnostic tests CT/XR from ER    Currently in Pain? Yes    Pain Score 5     Pain Location Shoulder    Pain Orientation Right;Posterior;Lateral    Pain Descriptors / Indicators Burning    Pain Type Acute pain    Pain Frequency Intermittent                               [facility] Adult PT Treatment/Exercise - 08/12/21 1102       Exercises   Exercises Neck      Neck Exercises: Machines for Strengthening   UBE  (Upper Arm Bike) L2.0 x 6 min (3' each fwd & back)    Cybex Row 20# 2 x10; cues for upright posture and core muscle activation with scap retraction on pull back    Lat Pull 15# x 10 seated pull to chest; 15# x 10 standing pull to chest; 10# x 10 straight arm pull-down      Neck Exercises: Standing   Wall Push Ups 20 reps      Shoulder Exercises: Prone   Extension Both;10 reps;Strengthening    Extension Limitations I's over green Pball    External Rotation Both;10 reps;Strengthening    External Rotation Limitations W's over green Pball    Horizontal ABduction 1 Both;10 reps;Strengthening    Horizontal ABduction 1 Limitations T's over green Pball    Horizontal ABduction 2 Both;10 reps;Strengthening    Horizontal ABduction 2 Limitations Y's over green Pball      Shoulder Exercises: Standing   External Rotation Right;5 reps;10 reps;Strengthening;Theraband    Theraband Level (Shoulder External Rotation) Level 2 (Red)    External Rotation Limitations 5x isometric  step out; 10x AROM with TB    Internal Rotation Right;5 reps;10 reps;Strengthening;Theraband    Theraband Level (Shoulder Internal Rotation) Level 2 (Red)    Internal Rotation Limitations 5x isometric step out; 10x AROM with TB    Extension Both;10 reps;Strengthening;Theraband   2 sets   Theraband Level (Shoulder Extension) Level 2 (Red)    Extension Limitations cues for scap retraction; 2nd set with slight long arm shoulder ER for increased scapular engagement    Row Both;10 reps;Strengthening;Theraband   2 sets   Theraband Level (Shoulder Row) Level 2 (Red)    Row Limitations cues for scap retraction; 2nd set with slight shoulder ER for increased scapular engagement      Manual Therapy   Manual Therapy Other (comment)    Other Manual Therapy Provided instruction in use of small (tennis) ball on wall for self-STM/release for posterior shoulder                       PT Short Term Goals - 08/12/21 1111       PT  SHORT TERM GOAL #1   Title Ind with initial HEP    Status Achieved   08/12/21   Target Date --      PT SHORT TERM GOAL #2   Title Patient to report decreased neck and right shoulder pain by 50% with ADLS    Status Achieved   08/12/21 - Pt reports 70% improvement in pain     PT SHORT TERM GOAL #3   Title Patient to understand and demonstrate the importance of correct posture in decreasing and preventing further pain.    Status On-going    Target Date 08/26/21               PT Long Term Goals - 08/09/21 0941       PT LONG TERM GOAL #1   Title ind with advanced HEP    Status On-going    Target Date 08/26/21      PT LONG TERM GOAL #2   Title Patient to demonstrate full functional neck ROM with 1/10 pain or less    Status On-going    Target Date 08/26/21      PT LONG TERM GOAL #3   Title Patient to demo full functional left shoulder ROM to allow her to perform OH ADLS and don/doff clothing.    Status On-going    Target Date 08/26/21      PT LONG TERM GOAL #4   Title Patient able to perform ADLS with >= 4+/5 R UE strength and pain at 1/10 or less    Status On-going    Target Date 08/26/21      PT LONG TERM GOAL #5   Title Patient able to sleep without waking from pain    Status On-going    Target Date 08/26/21                   Plan - 08/12/21 1146     Clinical Impression Statement Venora reports good relief from DN last visit with overall pain 70% improved since start of PT and pain now much more intermittent than constant - STG #2 met. She reports good tolerance for latest HEP update and able to provide good return demonstration with only cues to slow pace, and reports no concerns with previous HEP exercises - STG #1 met. She expressed interest in starting to workout at the gym before she goes back to work,  therefore reviewed best machine-based exercises, providing cues for proper set-up and movement patterns, and which UB machines to avoid. Cues still necessary  with many exercises for postural awareness and core muscle activation, but pt able to perform all exercises w/o increased pain. Pt progressing well toward goals and will continue to benefit from skilled PT to improve posture and pain free functional shoulder ROM and strength.    Rehab Potential Excellent    PT Frequency 2x / week    PT Duration 6 weeks    PT Treatment/Interventions ADLs/Self Care Home Management;Cryotherapy;Electrical Stimulation;Iontophoresis 65m/ml Dexamethasone;Moist Heat;Neuromuscular re-education;Therapeutic exercise;Therapeutic activities;Patient/family education;Manual techniques;Dry needling;Taping;Spinal Manipulations;Joint Manipulations    PT Next Visit Plan thoracic mobility; periscap strengthening; QL release right; STM +/- DN as indicated and benefit noted to right biceps/triceps/neck/scapular muscles prn; cont estim prn    PT Home Exercise Plan [email or web address]   Consulted and Agree with Plan of Care Patient             Patient will benefit from skilled therapeutic intervention in order to improve the following deficits and impairments:  Decreased range of motion, Pain, Impaired UE functional use, Increased muscle spasms, Impaired flexibility, Postural dysfunction, Decreased strength  Visit Diagnosis: Acute pain of right shoulder  Stiffness of right shoulder, not elsewhere classified  Cervicalgia  Other muscle spasm  Muscle weakness (generalized)     Problem List Patient Active Problem List   Diagnosis Date Noted   Rotator cuff strain, right, initial encounter 07/14/2021   Cervical strain 07/14/2021   Chest pain 04/04/2018   Hypertension 04/04/2018   GERD (gastroesophageal reflux disease) 04/04/2018    JPercival Spanish PT 08/12/2021, 12:04 PM  [facility] [address] [place] [address]Phone: [phone]  Fax:  [phone] Name: Dawn FaxonMRN: [id number] of  Birth: 7December 07, 1962

## 2021-08-16 ENCOUNTER — Other Ambulatory Visit: Payer: Self-pay

## 2021-08-16 ENCOUNTER — Encounter: Payer: Self-pay | Admitting: Physical Therapy

## 2021-08-16 ENCOUNTER — Ambulatory Visit: Payer: BC Managed Care – PPO | Admitting: Physical Therapy

## 2021-08-16 DIAGNOSIS — M542 Cervicalgia: Secondary | ICD-10-CM

## 2021-08-16 DIAGNOSIS — M6281 Muscle weakness (generalized): Secondary | ICD-10-CM

## 2021-08-16 DIAGNOSIS — M25611 Stiffness of right shoulder, not elsewhere classified: Secondary | ICD-10-CM

## 2021-08-16 DIAGNOSIS — M62838 Other muscle spasm: Secondary | ICD-10-CM

## 2021-08-16 DIAGNOSIS — M25511 Pain in right shoulder: Secondary | ICD-10-CM | POA: Diagnosis not present

## 2021-08-16 NOTE — Therapy (Signed)
[facility] [address]  [address] [place], [place], [address] Phone: [phone]   Fax:  [phone]  Physical Therapy Treatment  Patient Details  Name: Dawn Harrington MRN: [id number] Date of Birth: 1961/03/24 Referring Provider (PT): Clare Gandy MD   Encounter Date: 08/16/2021   PT End of Session - 08/16/21 0932     Visit Number 7    Date for PT Re-Evaluation 08/26/21    Authorization Type BCBS    PT Start Time 0932    PT Stop Time 1032    PT Time Calculation (min) 60 min    Activity Tolerance Patient tolerated treatment well    Behavior During Therapy [facility] for tasks assessed/performed             Past Medical History:  Diagnosis Date   Abnormal menstrual cycle 2006   Condyloma 03/2005   Dysrhythmia 2010   syncopal event-vf witnessed-shocked-catheterization   GERD (gastroesophageal reflux disease)    rarely   H/O dysmenorrhea 2005   Hip pain 2008   Hx of metrorrhagia 2005   Hypertension    Norvasc    Past Surgical History:  Procedure Laterality Date   APPENDECTOMY     heart cathetherization  2010   SHOULDER SURGERY  2012   [place]   TONSILLECTOMY      There were no vitals filed for this visit.   Subjective Assessment - 08/16/21 0935     Subjective Pt reports she is very sad because her uncle passed away on 2022/11/07 - she did not do her exercises over the weekend.    Patient is accompained by: Interpreter    Pertinent History HTN    Diagnostic tests CT/XR from ER    Currently in Pain? Yes    Pain Score 6     Pain Location Shoulder    Pain Orientation Right;Posterior;Lateral    Pain Descriptors / Indicators Sharp;Burning    Pain Type Acute pain    Pain Frequency Intermittent                               [facility] Adult PT Treatment/Exercise - 08/16/21 0932       Neck Exercises: Machines for Strengthening   UBE (Upper Arm Bike) L2.5 x 6 min (3' each fwd & back)       Shoulder Exercises: Standing   Horizontal ABduction Both;10 reps;Theraband;Strengthening    Theraband Level (Shoulder Horizontal ABduction) Level 2 (Red)    Horizontal ABduction Limitations back along doorframe to promote scap retraction    External Rotation Both;10 reps;Strengthening;Theraband    Theraband Level (Shoulder External Rotation) Level 2 (Red)    External Rotation Limitations back along doorframe to promote scap retraction    Extension Both;10 reps;Strengthening;Theraband   2 sets   Theraband Level (Shoulder Extension) Level 2 (Red)    Extension Limitations cues for scap retraction with slight long arm shoulder ER for increased scapular engagement    Row Both;10 reps;Strengthening;Theraband   2 sets   Theraband Level (Shoulder Row) Level 2 (Red)    Row Limitations cues for scap retraction with slight shoulder ER for increased scapular engagement    Diagonals Both;10 reps;Strengthening;Theraband    Theraband Level (Shoulder Diagonals) Level 2 (Red)    Diagonals Limitations alternating hi/low horiz abduction; back along doorframe to promote scap retraction      Modalities   Modalities Moist Heat  Moist Heat Therapy   Number Minutes Moist Heat 15 Minutes    Moist Heat Location Shoulder   R shoulder     Manual Therapy   Manual Therapy Soft tissue mobilization;Myofascial release    Manual therapy comments skilled palpation and monitoring of soft tissue during DN    Soft tissue mobilization STM/DTM to R UT, LS, infraspinatus, teres group, and deltoids    Myofascial Release manual TPR to R UT, LS, infraspinatus, teres group and posterolateral deltoids; pin and stretch to R UT and LS              Trigger Point Dry Needling - 08/16/21 0932     Consent Given? Yes    Muscles Treated Head and Neck Upper trapezius;Levator scapulae    Muscles Treated Upper Quadrant Infraspinatus;Deltoid;Teres major;Teres minor    Upper Trapezius Response Twitch reponse elicited;Palpable  increased muscle length    Levator Scapulae Response Twitch response elicited;Palpable increased muscle length    Infraspinatus Response Twitch response elicited;Palpable increased muscle length    Deltoid Response Twitch response elicited;Palpable increased muscle length   posterior & lateral   Teres major Response Twitch response elicited;Palpable increased muscle length    Teres minor Response Twitch response elicited;Palpable increased muscle length                     PT Short Term Goals - 08/16/21 0941       PT SHORT TERM GOAL #1   Title Ind with initial HEP    Status Achieved   08/12/21     PT SHORT TERM GOAL #2   Title Patient to report decreased neck and right shoulder pain by 50% with ADLS    Status Achieved   09/07/08 - Pt reports 70% improvement in pain     PT SHORT TERM GOAL #3   Title Patient to understand and demonstrate the importance of correct posture in decreasing and preventing further pain.    Status Achieved   08/16/21              PT Long Term Goals - 08/09/21 0941       PT LONG TERM GOAL #1   Title ind with advanced HEP    Status On-going    Target Date 08/26/21      PT LONG TERM GOAL #2   Title Patient to demonstrate full functional neck ROM with 1/10 pain or less    Status On-going    Target Date 08/26/21      PT LONG TERM GOAL #3   Title Patient to demo full functional left shoulder ROM to allow her to perform OH ADLS and don/doff clothing.    Status On-going    Target Date 08/26/21      PT LONG TERM GOAL #4   Title Patient able to perform ADLS with >= 4+/5 R UE strength and pain at 1/10 or less    Status On-going    Target Date 08/26/21      PT LONG TERM GOAL #5   Title Patient able to sleep without waking from pain    Status On-going    Target Date 08/26/21                   Plan - 08/16/21 1015     Clinical Impression Statement Dawn Harrington reports increased pain today after not completing her HEP over the weekend due  to grieving the loss of her uncle. She does feel like she  has better awareness of her posture and feels that this is helping. Continued increased muscle tension noted in R shoulder complex, esp UT, LS, infraspinatus, teres group and posterolateral deltoid - addressed with MT incorporating DN followed by scapular/postural strengthening. Pt reports pain reduced to 3/10 by end of session.    Rehab Potential Excellent    PT Frequency 2x / week    PT Duration 6 weeks    PT Treatment/Interventions ADLs/Self Care Home Management;Cryotherapy;Electrical Stimulation;Iontophoresis 4mg /ml Dexamethasone;Moist Heat;Neuromuscular re-education;Therapeutic exercise;Therapeutic activities;Patient/family education;Manual techniques;Dry needling;Taping;Spinal Manipulations;Joint Manipulations    PT Next Visit Plan thoracic mobility; periscap strengthening; QL release right; STM +/- DN as indicated and benefit noted to right biceps/triceps/neck/scapular muscles prn; cont estim prn    PT Home Exercise Plan [email or web address]    Consulted and Agree with Plan of Care Patient             Patient will benefit from skilled therapeutic intervention in order to improve the following deficits and impairments:  Decreased range of motion, Pain, Impaired UE functional use, Increased muscle spasms, Impaired flexibility, Postural dysfunction, Decreased strength  Visit Diagnosis: Acute pain of right shoulder  Stiffness of right shoulder, not elsewhere classified  Cervicalgia  Other muscle spasm  Muscle weakness (generalized)     Problem List Patient Active Problem List   Diagnosis Date Noted   Rotator cuff strain, right, initial encounter 07/14/2021   Cervical strain 07/14/2021   Chest pain 04/04/2018   Hypertension 04/04/2018   GERD (gastroesophageal reflux disease) 04/04/2018    Marry Guan, PT 08/16/2021, 12:33 PM  [facility] [address]  [address] [place], [place], [address] Phone: [phone]   Fax:  [phone]  Name: Dawn Harrington MRN: [id number] Date of Birth: 10-12-1960

## 2021-08-19 ENCOUNTER — Other Ambulatory Visit: Payer: Self-pay

## 2021-08-19 ENCOUNTER — Ambulatory Visit: Payer: BC Managed Care – PPO | Admitting: Physical Therapy

## 2021-08-19 ENCOUNTER — Encounter: Payer: Self-pay | Admitting: Physical Therapy

## 2021-08-19 DIAGNOSIS — M62838 Other muscle spasm: Secondary | ICD-10-CM

## 2021-08-19 DIAGNOSIS — M25511 Pain in right shoulder: Secondary | ICD-10-CM

## 2021-08-19 DIAGNOSIS — M25611 Stiffness of right shoulder, not elsewhere classified: Secondary | ICD-10-CM

## 2021-08-19 DIAGNOSIS — M6281 Muscle weakness (generalized): Secondary | ICD-10-CM

## 2021-08-19 DIAGNOSIS — M542 Cervicalgia: Secondary | ICD-10-CM

## 2021-08-19 NOTE — Therapy (Signed)
[facility] °[facility] [place] °[address]  [address] °[place], [place], [address] °Phone: [phone]   Fax:  [phone] ° °Physical Therapy Treatment ° °Patient Details  °Name: Dawn Harrington °MRN: [id number] °Date of Birth: 09/23/1960 °Referring Provider (PT): Jeremy Schmitz MD ° ° °Encounter Date: 08/19/2021 ° ° PT End of Session - 08/19/21 0931   ° ° Visit Number 8   ° Date for PT Re-Evaluation 08/26/21   ° Authorization Type BCBS   ° PT Start Time 0931   ° PT Stop Time 1028   ° PT Time Calculation (min) 57 min   ° Activity Tolerance Patient tolerated treatment well   ° Behavior During Therapy WFL for tasks assessed/performed   ° °  °  ° °  ° ° °Past Medical History:  °Diagnosis Date  ° Abnormal menstrual cycle 2006  ° Condyloma 03/2005  ° Dysrhythmia 2010  ° syncopal event-vf witnessed-shocked-catheterization  ° GERD (gastroesophageal reflux disease)   ° rarely  ° H/O dysmenorrhea 2005  ° Hip pain 2008  ° Hx of metrorrhagia 2005  ° Hypertension   ° Norvasc  ° ° °Past Surgical History:  °Procedure Laterality Date  ° APPENDECTOMY    ° heart cathetherization  2010  ° SHOULDER SURGERY  2012  ° [place]  ° TONSILLECTOMY    ° ° °There were no vitals filed for this visit. ° ° Subjective Assessment - 08/19/21 0934   ° ° Subjective Pt reports she is still having "a little bit of pain" but now more intermittent than before.   ° Patient is accompained by: Interpreter   ° Pertinent History HTN   ° Diagnostic tests CT/XR from ER   ° Currently in Pain? Yes   ° Pain Score 5    ° Pain Location Shoulder   ° Pain Orientation Right;Posterior;Lateral   ° Pain Descriptors / Indicators Sharp;Burning   ° Pain Type Acute pain   ° Pain Frequency Intermittent   ° °  °  ° °  ° ° ° ° ° [facility] PT Assessment - 08/19/21 0931   ° °  ° Assessment  ° Medical Diagnosis strain of neck muscle; Rt RC strain   ° Referring Provider (PT) Jeremy Schmitz MD   ° Onset Date/Surgical Date 07/10/21   ° Next MD Visit none  currently scheduled   °  ° AROM  ° Overall AROM Comments B shoulder ROM WNL w/o pain   ° Cervical Flexion full   ° Cervical Extension full   ° Cervical - Right Side Bend 24   ° Cervical - Left Side Bend 34   ° Cervical - Right Rotation 55   pain  ° Cervical - Left Rotation 72   ° °  °  ° °  ° ° ° ° ° ° ° ° ° ° ° ° ° ° ° ° [facility] Adult PT Treatment/Exercise - 08/19/21 0931   ° °  ° Self-Care  ° Self-Care Posture   ° Posture Provided education on proper desk and computer set-up for good posture when she returns to work, along with stretches/exercises that she can perform at her desk to help prevent her muscle from tightening up.   °  ° Exercises  ° Exercises Neck   °  ° Neck Exercises: Machines for Strengthening  ° UBE (Upper Arm Bike) L3.0 x 6 min (3' each fwd & back)   °  ° Neck Exercises: Seated  ° Cervical Rotation Right;Left;10   reps   ° Cervical Rotation Limitations SNAGs with pillowcase   ° Other Seated Exercise thoracic extension 10x3" over back of chair with hands clasped behind head for pec stretch   °  ° Neck Exercises: Stretches  ° Other Neck Stretches R ant/mid/post scalenes stretches with towel over shoulder 3 x 30 sec   °  ° Moist Heat Therapy  ° Number Minutes Moist Heat 15 Minutes   ° Moist Heat Location Shoulder;Cervical   R shoulder  ° °  °  ° °  ° ° ° ° ° ° ° ° ° ° PT Education - 08/19/21 1015   ° ° Education Details HEP update - thoracic extension, scalene stretches & cervical rotation SNAGs; education on office posture/computer set-up along wth stretches/exercises to be done at desk for break while working   ° Person(s) Educated Patient   ° Methods Explanation;Demonstration;Verbal cues;Handout   ° Comprehension Verbalized understanding;Verbal cues required;Returned demonstration;Need further instruction   ° °  °  ° °  ° ° ° PT Short Term Goals - 08/16/21 0941   ° °  ° PT SHORT TERM GOAL #1  ° Title Ind with initial HEP   ° Status Achieved   08/12/21  °  ° PT SHORT TERM GOAL #2  ° Title Patient to  report decreased neck and right shoulder pain by 50% with ADLS   ° Status Achieved   08/12/21 - Pt reports 70% improvement in pain  °  ° PT SHORT TERM GOAL #3  ° Title Patient to understand and demonstrate the importance of correct posture in decreasing and preventing further pain.   ° Status Achieved   08/16/21  ° °  °  ° °  ° ° ° ° PT Long Term Goals - 08/19/21 0941   ° °  ° PT LONG TERM GOAL #1  ° Title ind with advanced HEP   ° Status Partially Met   08/19/21 - met for current HEP  ° Target Date 08/26/21   °  ° PT LONG TERM GOAL #2  ° Title Patient to demonstrate full functional neck ROM with 1/10 pain or less   ° Status Partially Met   08/19/21 - cervical ROM WFL but tightness noted with flexion & extension, and pain up to 6/10 with end range R rotation  ° Target Date 08/26/21   °  ° PT LONG TERM GOAL #3  ° Title Patient to demo full functional left shoulder ROM to allow her to perform OH ADLS and don/doff clothing.   ° Status Achieved   08/19/21  ° Target Date --   °  ° PT LONG TERM GOAL #4  ° Title Patient able to perform ADLS with >= 4+/5 R UE strength and pain at 1/10 or less   ° Status On-going   ° Target Date 08/26/21   °  ° PT LONG TERM GOAL #5  ° Title Patient able to sleep without waking from pain   ° Status Achieved   08/19/21 - pt reports pain no longer wakes her up  ° Target Date --   ° °  °  ° °  ° ° ° ° ° ° ° ° Plan - 08/19/21 1028   ° ° Clinical Impression Statement Dawn Harrington reports her pain has been much better, no longer waking her up from sleep (LTG #5 met). B shoulder ROM now WNL and symmetrical w/o pain (LTG #3 met), and pain only reported with end range R cervical   rotation (LTG #2 partially met). Further exercises and stretches provided to address remaining tightness and limited cervical ROM with pt stating she likes how the new exercises feel - HEP updated accordingly. We reviewed proper desk and computer set-up for when she returns to work along with stretches/exercises that she can perform at her  desk to help prevent her muscle from tightening up. Pt inquiring about purchasing a postural brace to wear at work - explained concerns about relying on brace resulting in decreased active use of muscles. Dawn Harrington is progressing well toward her LTGs but will continue to benefit from skilled PT to further reduce pain and restore functional cervical ROM w/o pain limiting.   ° Rehab Potential Excellent   ° PT Frequency 2x / week   ° PT Duration 6 weeks   ° PT Treatment/Interventions ADLs/Self Care Home Management;Cryotherapy;Electrical Stimulation;Iontophoresis 4mg/ml Dexamethasone;Moist Heat;Neuromuscular re-education;Therapeutic exercise;Therapeutic activities;Patient/family education;Manual techniques;Dry needling;Taping;Spinal Manipulations;Joint Manipulations   ° PT Next Visit Plan thoracic mobility; periscap strengthening; QL release right; STM +/- DN as indicated and benefit noted to right biceps/triceps/neck/scapular muscles prn; cont estim prn   ° PT Home Exercise Plan [id number]   ° Consulted and Agree with Plan of Care Patient   ° °  °  ° °  ° ° °Patient will benefit from skilled therapeutic intervention in order to improve the following deficits and impairments:  Decreased range of motion, Pain, Impaired UE functional use, Increased muscle spasms, Impaired flexibility, Postural dysfunction, Decreased strength ° °Visit Diagnosis: °Acute pain of right shoulder ° °Stiffness of right shoulder, not elsewhere classified ° °Cervicalgia ° °Other muscle spasm ° °Muscle weakness (generalized) ° ° ° ° °Problem List °Patient Active Problem List  ° Diagnosis Date Noted  ° Rotator cuff strain, right, initial encounter 07/14/2021  ° Cervical strain 07/14/2021  ° Chest pain 04/04/2018  ° Hypertension 04/04/2018  ° GERD (gastroesophageal reflux disease) 04/04/2018  ° ° °JoAnne M Kreis, PT °08/19/2021, 10:34 AM ° ° °[facility] [place] °[address]  [address] °[place], [place],  [address] °Phone: [phone]   Fax:  [phone] ° °Name: Dawn Harrington °MRN: [id number] °Date of Birth: 01/24/1961 ° ° ° °

## 2021-08-19 NOTE — Patient Instructions (Signed)
° ° °  Access Code: [email or web address] URL: [email or web address] Date: 08/19/2021 Prepared by: Glenetta Hew  Exercises Seated Cervical Rotation AROM - 2 x daily - 7 x weekly - 1 sets - 5 reps - 5 sec hold Seated Cervical Sidebending AROM - 2 x daily - 7 x weekly - 1 sets - 5 reps - 5 sec hold Seated Cervical Retraction Protraction AROM - 2 x daily - 7 x weekly - 1 sets - 10 reps - 5 sec hold Seated Scapular Retraction - 4 x daily - 7 x weekly - 1 sets - 10 reps - 2-3 sec hold Seated Shoulder Horizontal Abduction with Resistance - Palms Down - 1 x daily - 7 x weekly - 3 sets - 10 reps Shoulder External Rotation and Scapular Retraction with Resistance - 1 x daily - 7 x weekly - 3 sets - 10 reps Standing Bilateral Low Shoulder Row with Anchored Resistance - 1 x daily - 7 x weekly - 2 sets - 10 reps - 5 sec hold Scapular Retraction with Resistance Advanced - 1 x daily - 7 x weekly - 2 sets - 10 reps - 5 sec hold Seated Scalene Stretch with Towel - 2 x daily - 7 x weekly - 3 reps - 30 sec hold Seated Thoracic Lumbar Extension with Pectoralis Stretch - 1 x daily - 7 x weekly - 2 sets - 10 reps - 3 sec hold Seated Assisted Cervical Rotation with Towel - 1 x daily - 7 x weekly - 2 sets - 10 reps - 3 sec hold Seated Assisted Cervical Rotation with Towel (Mirrored) - 1 x daily - 7 x weekly - 2 sets - 10 reps - 3 sec hold  Patient Education Engineer, production for [employer]

## 2021-08-23 ENCOUNTER — Encounter: Payer: Self-pay | Admitting: Physical Therapy

## 2021-08-23 ENCOUNTER — Ambulatory Visit: Payer: BC Managed Care – PPO | Admitting: Physical Therapy

## 2021-08-23 ENCOUNTER — Other Ambulatory Visit: Payer: Self-pay

## 2021-08-23 DIAGNOSIS — M25611 Stiffness of right shoulder, not elsewhere classified: Secondary | ICD-10-CM

## 2021-08-23 DIAGNOSIS — M25511 Pain in right shoulder: Secondary | ICD-10-CM

## 2021-08-23 DIAGNOSIS — M6281 Muscle weakness (generalized): Secondary | ICD-10-CM

## 2021-08-23 DIAGNOSIS — M62838 Other muscle spasm: Secondary | ICD-10-CM

## 2021-08-23 DIAGNOSIS — M542 Cervicalgia: Secondary | ICD-10-CM

## 2021-08-23 NOTE — Therapy (Signed)
[facility] [address]  [place], [place], [address] Phone: [phone]   Fax:  [phone]  Physical Therapy Treatment  Patient Details  Name: Dawn Harrington MRN: [id number] Date of Birth: 1961-04-22 Referring Provider (PT): Clearance Coots MD   Encounter Date: 08/23/2021   PT End of Session - 08/23/21 0935     Visit Number 9    Date for PT Re-Evaluation 08/26/21    Authorization Type BCBS    PT Start Time 0935   Pt arrived late   PT Stop Time 1014    PT Time Calculation (min) 39 min    Activity Tolerance Patient tolerated treatment well    Behavior During Therapy [facility] for tasks assessed/performed             Past Medical History:  Diagnosis Date   Abnormal menstrual cycle 2006   Condyloma 03/2005   Dysrhythmia 2010   syncopal event-vf witnessed-shocked-catheterization   GERD (gastroesophageal reflux disease)    rarely   H/O dysmenorrhea 2005   Hip pain 2008   Hx of metrorrhagia 2005   Hypertension    Norvasc    Past Surgical History:  Procedure Laterality Date   APPENDECTOMY     heart cathetherization  2010   SHOULDER SURGERY  2012   [facility]   TONSILLECTOMY      There were no vitals filed for this visit.   Subjective Assessment - 08/23/21 0937     Subjective Pt denies pain currently but still having some pain in R shoulder at night which woke her up last night - she feels this may be due to having done a lot of chores around the house yesterday.    Patient is accompained by: Interpreter    Pertinent History HTN    Diagnostic tests CT/XR from ER    Currently in Pain? No/denies                               [facility] Adult PT Treatment/Exercise - 08/23/21 0935       Therapeutic Activites    Therapeutic Activities Other Therapeutic Activities      Neck Exercises: Machines for Strengthening   UBE (Upper Arm Bike) L3.0 x 6 min (3' each fwd & back)      Neck Exercises:  Standing   Wall Push Ups 10 reps   2 sets   Wall Push Ups Limitations on green Pball on wall      Neck Exercises: Seated   Neck Retraction 10 reps;3 secs    Neck Retraction Limitations red TB    Cervical Rotation Right;Left;10 reps    Cervical Rotation Limitations SNAGs with pillowcase    Other Seated Exercise thoracic extension 10x3" over back of chair with pillow-case assisted cerivcal extension      Shoulder Exercises: Prone   Extension Both;10 reps;Strengthening   2 sets   Extension Weight (lbs) 1   2nd set   Extension Limitations I's over green Pball    External Rotation Both;10 reps;Strengthening    External Rotation Weight (lbs) 1    External Rotation Limitations W's over green Pball    Horizontal ABduction 1 Both;10 reps;Strengthening    Horizontal ABduction 1 Weight (lbs) 1    Horizontal ABduction 1 Limitations T's over green Pball    Horizontal ABduction 2 Both;10 reps;Strengthening    Horizontal ABduction 2 Weight (lbs) 1    Horizontal  ABduction 2 Limitations Y's over green Pball      Shoulder Exercises: Standing   Horizontal ABduction Both;10 reps;Theraband;Strengthening    Theraband Level (Shoulder Horizontal ABduction) Level 3 (Green)    Horizontal ABduction Limitations back along doorframe to promote scap retraction    External Rotation Both;10 reps;Strengthening;Theraband    Theraband Level (Shoulder External Rotation) Level 3 (Green)    External Rotation Limitations back along doorframe to promote scap retraction    Extension Both;10 reps;Strengthening;Theraband   2 sets   Theraband Level (Shoulder Extension) Level 3 (Green)    Extension Limitations cues for scap retraction with slight long arm shoulder ER for increased scapular engagement    Row Both;10 reps;Strengthening;Theraband   2 sets   Theraband Level (Shoulder Row) Level 3 (Green)    Row Limitations cues for scap retraction with slight shoulder ER for increased scapular engagement    Diagonals Both;10  reps;Strengthening;Theraband    Theraband Level (Shoulder Diagonals) Level 3 (Green)    Diagonals Limitations alternating hi/low horiz abduction; back along doorframe to promote scap retraction                       PT Short Term Goals - 08/16/21 0941       PT SHORT TERM GOAL #1   Title Ind with initial HEP    Status Achieved   08/12/21     PT SHORT TERM GOAL #2   Title Patient to report decreased neck and right shoulder pain by 50% with ADLS    Status Achieved   08/12/21 - Pt reports 70% improvement in pain     PT SHORT TERM GOAL #3   Title Patient to understand and demonstrate the importance of correct posture in decreasing and preventing further pain.    Status Achieved   08/16/21              PT Long Term Goals - 08/19/21 0941       PT LONG TERM GOAL #1   Title ind with advanced HEP    Status Partially Met   08/19/21 - met for current HEP   Target Date 08/26/21      PT LONG TERM GOAL #2   Title Patient to demonstrate full functional neck ROM with 1/10 pain or less    Status Partially Met   08/19/21 - cervical ROM WFL but tightness noted with flexion & extension, and pain up to 6/10 with end range R rotation   Target Date 08/26/21      PT LONG TERM GOAL #3   Title Patient to demo full functional left shoulder ROM to allow her to perform OH ADLS and don/doff clothing.    Status Achieved   08/19/21   Target Date --      PT LONG TERM GOAL #4   Title Patient able to perform ADLS with >= 4+/5 R UE strength and pain at 1/10 or less    Status On-going    Target Date 08/26/21      PT LONG TERM GOAL #5   Title Patient able to sleep without waking from pain    Status Achieved   08/19/21 - pt reports pain no longer wakes her up   Target Date --                   Plan - 08/23/21 0942     Clinical Impression Statement Dawn Harrington denies pain this morning but reports increased pain which woke her  from sleep last night after busy day cleaning house - she thinks  this may have been the trigger. We discussed proper posture and body mechanics for typical household chore to reduce strain on shoulders and neck. Continued scapular and thoracic strengthening with progression of theraband resistance to green TB and added 1# weights to prone strengthening over green Pball. Reviewed cervical rotation SNAGs and introduced assisted cervical extension with thoracic extension. Pt denies need for any further review of HEP.    Rehab Potential Excellent    PT Frequency 2x / week    PT Duration 6 weeks    PT Treatment/Interventions ADLs/Self Care Home Management;Cryotherapy;Electrical Stimulation;Iontophoresis 40m/ml Dexamethasone;Moist Heat;Neuromuscular re-education;Therapeutic exercise;Therapeutic activities;Patient/family education;Manual techniques;Dry needling;Taping;Spinal Manipulations;Joint Manipulations    PT Next Visit Plan thoracic mobility; periscap strengthening; QL release right; STM +/- DN as indicated and benefit noted to right biceps/triceps/neck/scapular muscles prn; cont estim prn    PT Home Exercise Plan [id number]   Consulted and Agree with Plan of Care Patient             Patient will benefit from skilled therapeutic intervention in order to improve the following deficits and impairments:  Decreased range of motion, Pain, Impaired UE functional use, Increased muscle spasms, Impaired flexibility, Postural dysfunction, Decreased strength  Visit Diagnosis: Acute pain of right shoulder  Stiffness of right shoulder, not elsewhere classified  Cervicalgia  Other muscle spasm  Muscle weakness (generalized)     Problem List Patient Active Problem List   Diagnosis Date Noted   Rotator cuff strain, right, initial encounter 07/14/2021   Cervical strain 07/14/2021   Chest pain 04/04/2018   Hypertension 04/04/2018   GERD (gastroesophageal reflux disease) 04/04/2018    JPercival Spanish PT 08/23/2021, 10:15 AM  [facility] [place] [address]Phone: [phone]  Fax:  [phone] Name: Dawn GrandmaisonMRN: [id number] of Birth: 707/11/1960

## 2021-08-26 ENCOUNTER — Other Ambulatory Visit: Payer: Self-pay

## 2021-08-26 ENCOUNTER — Ambulatory Visit: Payer: BC Managed Care – PPO

## 2021-08-26 DIAGNOSIS — M25511 Pain in right shoulder: Secondary | ICD-10-CM

## 2021-08-26 DIAGNOSIS — M542 Cervicalgia: Secondary | ICD-10-CM

## 2021-08-26 DIAGNOSIS — M6281 Muscle weakness (generalized): Secondary | ICD-10-CM

## 2021-08-26 DIAGNOSIS — M25611 Stiffness of right shoulder, not elsewhere classified: Secondary | ICD-10-CM

## 2021-08-26 DIAGNOSIS — M62838 Other muscle spasm: Secondary | ICD-10-CM

## 2021-08-26 NOTE — Therapy (Addendum)
[facility] [address]  [place], [place], [address] Phone: [phone]   Fax:  [phone]  Physical Therapy Treatment / Recert  Patient Details  Name: Dawn Harrington MRN: [id number] Date of Birth: January 05, 1961 Referring Provider (PT): Clearance Coots MD  Progress Note  Reporting Period 07/15/2021 to 08/26/2021  See note below for Objective Data and Assessment of Progress/Goals.     Encounter Date: 08/26/2021   PT End of Session - 08/26/21 1046     Visit Number 10    Date for PT Re-Evaluation 10/07/21    Authorization Type BCBS    PT Start Time 0930    PT Stop Time 1023    PT Time Calculation (min) 53 min    Activity Tolerance Patient tolerated treatment well    Behavior During Therapy [facility] for tasks assessed/performed             Past Medical History:  Diagnosis Date   Abnormal menstrual cycle 2006   Condyloma 03/2005   Dysrhythmia 2010   syncopal event-vf witnessed-shocked-catheterization   GERD (gastroesophageal reflux disease)    rarely   H/O dysmenorrhea 2005   Hip pain 2008   Hx of metrorrhagia 2005   Hypertension    Norvasc    Past Surgical History:  Procedure Laterality Date   APPENDECTOMY     heart cathetherization  2010   SHOULDER SURGERY  2012   [facility]   TONSILLECTOMY      There were no vitals filed for this visit.   Subjective Assessment - 08/26/21 0934     Subjective Pt reports that her pain fluctuates and notices that it worsens when doing more activities during the day and sleeping later that night.    Patient is accompained by: Interpreter    Pertinent History HTN    Diagnostic tests CT/XR from ER    Currently in Pain? Yes    Pain Score 5     Pain Location Shoulder    Pain Orientation Right;Posterior;Lateral    Pain Descriptors / Indicators Sharp;Burning                [facility] PT Assessment - 08/26/21 0001       Assessment   Medical Diagnosis strain of neck  muscle; Rt RC strain    Referring Provider (PT) Clearance Coots MD    Onset Date/Surgical Date 07/10/21      AROM   Right Shoulder Flexion 140 Degrees    Right Shoulder ABduction 151 Degrees    Right Shoulder Internal Rotation --   FIR to L2   Right Shoulder External Rotation --   FER to C7   Cervical - Right Side Bend 26    Cervical - Left Side Bend 35    Cervical - Right Rotation 53    Cervical - Left Rotation 69      Strength   Right Shoulder Flexion 4+/5    Right Shoulder ABduction 4+/5    Right Shoulder Internal Rotation 4+/5    Right Shoulder External Rotation 4+/5                           [facility] Adult PT Treatment/Exercise - 08/26/21 0001       Neck Exercises: Machines for Strengthening   UBE (Upper Arm Bike) L3.0 x 6 min (3' each fwd & back)      Neck Exercises: Theraband   Shoulder Extension 10 reps;Green  Rows 10 reps;Green    Shoulder External Rotation 10 reps;Green    Horizontal ABduction 10 reps;Green                       PT Short Term Goals - 08/16/21 0941       PT SHORT TERM GOAL #1   Title Ind with initial HEP    Status Achieved   08/12/21     PT SHORT TERM GOAL #2   Title Patient to report decreased neck and right shoulder pain by 50% with ADLS    Status Achieved   08/12/21 - Pt reports 70% improvement in pain     PT SHORT TERM GOAL #3   Title Patient to understand and demonstrate the importance of correct posture in decreasing and preventing further pain.    Status Achieved   08/16/21              PT Long Term Goals - 08/26/21 0949       PT LONG TERM GOAL #1   Title ind with advanced HEP    Status Partially Met   08/19/21 - met for current HEP   Target Date 10/07/21      PT LONG TERM GOAL #2   Title Patient to demonstrate full functional neck ROM with 1/10 pain or less    Status Partially Met   08/26/21- R side bend 4/10 pain, more tight with R SB and rot   Target Date 10/07/21      PT LONG TERM GOAL #3    Title Patient to demo full functional left shoulder ROM to allow her to perform OH ADLS and don/doff clothing.    Status Achieved   08/19/21     PT LONG TERM GOAL #4   Title Patient able to perform ADLS with >= 4+/5 R UE strength and pain at 1/10 or less    Status Partially Met   08/26/21- 3/10 pain with MMT but 4+/5 in R shoulder   Target Date 10/07/21      PT LONG TERM GOAL #5   Title Patient able to sleep without waking from pain    Status Achieved   08/19/21 - pt reports pain no longer wakes her up                  Plan - 08/26/21 1048     Clinical Impression Statement Pt arrived today feeling pleased with progress from PT. Cervical ROM remains the same, more noteable tightness was seen with R rot and side bend, these motions continue causing 4/10 pain. R shoulder ROM is improving, functional ER and IR both caused some pain, she has met her shoulder ROM goal as she is not limited with any ADLs requiring shoulder use. R shoulder strength has improved, now globally 4+/5 but she reported some pain with the resistance given during MMT. Due to the assessment, she is making progress but due to the continued pain experienced with certain movements she benfit from continued PT to improve ADLs within pain-free movement. She noted that she was returning to work on March 6th and would like to continue working with PT to build confidence with work related activities. Pt would continue to benefit from PT for additional 2x a week for 6 weeks in order to restore her functional performance.    PT Frequency 2x / week    PT Duration 6 weeks    PT Treatment/Interventions ADLs/Self Care Home Management;Cryotherapy;Electrical Stimulation;Iontophoresis 44m/ml Dexamethasone;Moist  Heat;Neuromuscular re-education;Therapeutic exercise;Therapeutic activities;Patient/family education;Manual techniques;Dry needling;Taping;Spinal Manipulations;Joint Manipulations    PT Next Visit Plan thoracic mobility; periscap  strengthening; QL release right; STM +/- DN as indicated and benefit noted to right biceps/triceps/neck/scapular muscles prn; cont estim prn    PT Home Exercise Plan [email or web address]    Consulted and Agree with Plan of Care Patient             Patient will benefit from skilled therapeutic intervention in order to improve the following deficits and impairments:  Decreased range of motion, Pain, Impaired UE functional use, Increased muscle spasms, Impaired flexibility, Postural dysfunction, Decreased strength  Visit Diagnosis: Acute pain of right shoulder  Stiffness of right shoulder, not elsewhere classified  Cervicalgia  Other muscle spasm  Muscle weakness (generalized)     Problem List Patient Active Problem List   Diagnosis Date Noted   Rotator cuff strain, right, initial encounter 07/14/2021   Cervical strain 07/14/2021   Chest pain 04/04/2018   Hypertension 04/04/2018   GERD (gastroesophageal reflux disease) 04/04/2018    Artist Pais, PTA 08/26/2021, 12:01 PM  [facility] [address]  [place], [place], [address] Phone: [phone]   Fax:  [phone]  Name: Dawn Harrington MRN: [id number] Date of Birth: [birth year]   Dawn Harrington is pleased with her progress thus far with PT. She has met several goals and the remainder are partially met as above. She will be returning to work on 09/06/21 and would like to continue working with PT to address her remaining deficits and to build confidence with work related activities, therefore will proceed with recert for additional 0-7W/KG for up to 6 weeks in order to restore her functional performance.  Percival Spanish, PT, MPT 08/26/21, 1:50 PM  [facility] [address]  [place], [place], [address] Phone: [phone]   Fax:  [phone]

## 2021-08-26 NOTE — Addendum Note (Signed)
Addended by: Percival Spanish on: 08/26/2021 01:54 PM   Modules accepted: Orders

## 2021-08-30 ENCOUNTER — Ambulatory Visit: Payer: BC Managed Care – PPO

## 2021-09-02 ENCOUNTER — Ambulatory Visit: Payer: BC Managed Care – PPO | Attending: Family Medicine | Admitting: Physical Therapy

## 2021-09-02 ENCOUNTER — Encounter: Payer: Self-pay | Admitting: Physical Therapy

## 2021-09-02 ENCOUNTER — Other Ambulatory Visit: Payer: Self-pay

## 2021-09-02 DIAGNOSIS — M542 Cervicalgia: Secondary | ICD-10-CM | POA: Diagnosis present

## 2021-09-02 DIAGNOSIS — M62838 Other muscle spasm: Secondary | ICD-10-CM

## 2021-09-02 DIAGNOSIS — M6281 Muscle weakness (generalized): Secondary | ICD-10-CM | POA: Diagnosis present

## 2021-09-02 DIAGNOSIS — M25511 Pain in right shoulder: Secondary | ICD-10-CM | POA: Diagnosis not present

## 2021-09-02 DIAGNOSIS — M25611 Stiffness of right shoulder, not elsewhere classified: Secondary | ICD-10-CM

## 2021-09-02 NOTE — Therapy (Signed)
[facility] ?[place] ?[place], [place], [address] ?Phone: [phone]   Fax:  [phone] ? ?Physical Therapy Treatment ? ?Patient Details  ?Name: Dawn Harrington ?MRN: [id number] ?Date of Birth: Jan 16, 1961 ?Referring Provider (PT): Clearance Coots MD ? ? ?Encounter Date: 09/02/2021 ? ? PT End of Session - 09/02/21 0931   ? ? Visit Number 11   ? Date for PT Re-Evaluation 10/07/21   ? Authorization Type BCBS   ? PT Start Time [phone]   ? PT Stop Time 1015   ? PT Time Calculation (min) 44 min   ? Activity Tolerance Patient tolerated treatment well   ? Behavior During Therapy [facility] for tasks assessed/performed   ? ?  ?  ? ?  ? ? ?Past Medical History:  ?Diagnosis Date  ? Abnormal menstrual cycle 2006  ? Condyloma 03/2005  ? Dysrhythmia 2010  ? syncopal event-vf witnessed-shocked-catheterization  ? GERD (gastroesophageal reflux disease)   ? rarely  ? H/O dysmenorrhea 2005  ? Hip pain 2008  ? Hx of metrorrhagia 2005  ? Hypertension   ? Norvasc  ? ? ?Past Surgical History:  ?Procedure Laterality Date  ? APPENDECTOMY    ? heart cathetherization  2010  ? SHOULDER SURGERY  2012  ? [facility]  ? TONSILLECTOMY    ? ? ?There were no vitals filed for this visit. ? ? Subjective Assessment - 09/02/21 0935   ? ? Subjective Pain is still in the same place in her shoulder which still limits her when she tries to lift her arm up. Neck has been better but still bothers her a little at times.   ? Patient is accompained by: Interpreter   ? Pertinent History HTN   ? Diagnostic tests CT/XR from ER   ? Currently in Pain? Yes   ? Pain Score 4    ? Pain Location Shoulder   ? Pain Orientation Right;Posterior;Lateral   ? ?  ?  ? ?  ? ? ? ? ? ? ? ? ? ? ? ? ? ? ? ? ? ? ? ? [facility] Adult PT Treatment/Exercise - 09/02/21 0931   ? ?  ? Neck Exercises: Machines for Strengthening  ? UBE (Upper Arm Bike) L3.0 x 6 min (3' each fwd & back)   ?  ? Shoulder Exercises: Standing  ? Flexion Right;12  reps;Strengthening   ? Theraband Level (Shoulder Flexion) Level 2 (Red)   ? Flexion Limitations cue to avoid shoulder/scapular protraction   ? ABduction Right;12 reps;Strengthening;Theraband   ? Theraband Level (Shoulder ABduction) Level 2 (Red)   ? Shoulder ABduction Weight (lbs) scaption   ? Other Standing Exercises B green TB lat pulldown 10 x 3"   ? Other Standing Exercises B green TB "W" row 10 x 3"   ?  ? Manual Therapy  ? Manual Therapy Soft tissue mobilization;Myofascial release   ? Manual therapy comments skilled palpation and monitoring of soft tissue during DN   ? Soft tissue mobilization STM/DTM to R UT, LS, infraspinatus, teres group, and deltoids   ? Myofascial Release manual TPR to R UT, LS, infraspinatus, teres group and posterolateral deltoids; pin and stretch to R UT and LS   ? ?  ?  ? ?  ? ? ? Tr[place] Point Dry Needling - 09/02/21 0931   ? ? Consent Given? Yes   ? Muscles Treated Head and Neck Upper trapezius;Levator scapulae   ? Muscles Treated Upper  Quadrant Infraspinatus;Deltoid;Teres major;Teres minor   ? Dry Needling Comments Right   ? Upper Trapezius Response Twitch reponse elicited;Palpable increased muscle length   ? Levator Scapulae Response Twitch response elicited;Palpable increased muscle length   ? Infraspinatus Response Twitch response elicited;Palpable increased muscle length   ? Deltoid Response Twitch response elicited;Palpable increased muscle length   posterior & lateral  ? Teres major Response Twitch response elicited;Palpable increased muscle length   ? Teres minor Response Twitch response elicited;Palpable increased muscle length   ? ?  ?  ? ?  ? ? ? ? ? ? ? ? ? ? PT Short Term Goals - 08/16/21 0941   ? ?  ? PT SHORT TERM GOAL #1  ? Title Ind with initial HEP   ? Status Achieved   08/12/21  ?  ? PT SHORT TERM GOAL #2  ? Title Patient to report decreased neck and right shoulder pain by 50% with ADLS   ? Status Achieved   08/12/21 - Pt reports 70% improvement in pain  ?  ? PT SHORT  TERM GOAL #3  ? Title Patient to understand and demonstrate the importance of correct posture in decreasing and preventing further pain.   ? Status Achieved   08/16/21  ? ?  ?  ? ?  ? ? ? ? PT Long Term Goals - 08/26/21 0949   ? ?  ? PT LONG TERM GOAL #1  ? Title ind with advanced HEP   ? Status Partially Met   08/19/21 - met for current HEP  ? Target Date 10/07/21   ?  ? PT LONG TERM GOAL #2  ? Title Patient to demonstrate full functional neck ROM with 1/10 pain or less   ? Status Partially Met   08/26/21- R side bend 4/10 pain, more tight with R SB and rot  ? Target Date 10/07/21   ?  ? PT LONG TERM GOAL #3  ? Title Patient to demo full functional left shoulder ROM to allow her to perform OH ADLS and don/doff clothing.   ? Status Achieved   08/19/21  ?  ? PT LONG TERM GOAL #4  ? Title Patient able to perform ADLS with >= 4+/5 R UE strength and pain at 1/10 or less   ? Status Partially Met   08/26/21- 3/10 pain with MMT but 4+/5 in R shoulder  ? Target Date 10/07/21   ?  ? PT LONG TERM GOAL #5  ? Title Patient able to sleep without waking from pain   ? Status Achieved   08/19/21 - pt reports pain no longer wakes her up  ? ?  ?  ? ?  ? ? ? ? ? ? ? ? Plan - 09/02/21 1015   ? ? Clinical Impression Statement Kamyla?s primary complaint today is ongoing R shoulder pain which limits her ability to raise her arm. She reports continued pain and increased R posterior and lateral shoulder with TPs and taut bands identified - addressed with MT incorporating DN with good twitch responses elicited and pt able to demonstrate full B shoulder ROM w/o pain following MT and DN. Progressed R shoulder strengthening while promoting postural and scapular movement/stabilization awareness with good tolerance.   ? Rehab Potential Excellent   ? PT Frequency 2x / week   ? PT Duration 6 weeks   ? PT Treatment/Interventions ADLs/Self Care Home Management;Cryotherapy;Electrical Stimulation;Iontophoresis 4mg /ml Dexamethasone;Moist Heat;Neuromuscular  re-education;Therapeutic exercise;Therapeutic activities;Patient/family education;Manual techniques;Dry needling;Taping;Spinal Manipulations;Joint Manipulations   ?  PT Next Visit Plan thoracic mobility; periscap strengthening; QL release right; STM +/- DN as indicated and benefit noted to right biceps/triceps/neck/scapular muscles prn; cont estim prn   ? PT Home Exercise Plan [id number]   ? Consulted and Agree with Plan of Care Patient   ? ?  ?  ? ?  ? ? ?Patient will benefit from skilled therapeutic intervention in order to improve the following deficits and impairments:  Decreased range of motion, Pain, Impaired UE functional use, Increased muscle spasms, Impaired flexibility, Postural dysfunction, Decreased strength ? ?Visit Diagnosis: ?Acute pain of right shoulder ? ?Stiffness of right shoulder, not elsewhere classified ? ?Cervicalgia ? ?Other muscle spasm ? ?Muscle weakness (generalized) ? ? ? ? ?Problem List ?Patient Active Problem List  ? Diagnosis Date Noted  ? Rotator cuff strain, right, initial encounter 07/14/2021  ? Cervical strain 07/14/2021  ? Chest pain 04/04/2018  ? Hypertension 04/04/2018  ? GERD (gastroesophageal reflux disease) 04/04/2018  ? ? ?Percival Spanish, PT ?09/02/2021, 12:45 PM ? ?[place] [facility] ?[place] ?[place], [place], [address] ?Phone: [phone]   Fax:  [phone] ? ?Name: [facility] ?MRN: [id number] ?Date of Birth: [birth year] ? ? ? ?

## 2021-09-21 ENCOUNTER — Encounter: Payer: Self-pay | Admitting: Physical Therapy

## 2021-09-21 ENCOUNTER — Ambulatory Visit: Payer: BC Managed Care – PPO | Admitting: Physical Therapy

## 2021-09-21 ENCOUNTER — Other Ambulatory Visit: Payer: Self-pay

## 2021-09-21 DIAGNOSIS — M25611 Stiffness of right shoulder, not elsewhere classified: Secondary | ICD-10-CM

## 2021-09-21 DIAGNOSIS — M62838 Other muscle spasm: Secondary | ICD-10-CM

## 2021-09-21 DIAGNOSIS — M25511 Pain in right shoulder: Secondary | ICD-10-CM | POA: Diagnosis not present

## 2021-09-21 DIAGNOSIS — M6281 Muscle weakness (generalized): Secondary | ICD-10-CM

## 2021-09-21 DIAGNOSIS — M542 Cervicalgia: Secondary | ICD-10-CM

## 2021-09-21 NOTE — Therapy (Signed)
[place] ?[facility] ?[place] ?[place], [place], [address] ?Phone: [phone]   Fax:  [phone] ? ?Physical Therapy Treatment ? ?Patient Details  ?Name: Dawn Harrington ?MRN: [id number] ?Date of Birth: 27-Apr-1961 ?Referring Provider (PT): Clearance Coots MD ? ? ?Encounter Date: 09/21/2021 ? ? PT End of Session - 09/21/21 1708   ? ? Visit Number 12   ? Date for PT Re-Evaluation 10/07/21   ? Authorization Type BCBS   ? PT Start Time 1708   ? PT Stop Time 1756   ? PT Time Calculation (min) 48 min   ? Activity Tolerance Patient tolerated treatment well   ? Behavior During Therapy [facility] for tasks assessed/performed   ? ?  ?  ? ?  ? ? ?Past Medical History:  ?Diagnosis Date  ? Abnormal menstrual cycle 2006  ? Condyloma 03/2005  ? Dysrhythmia 2010  ? syncopal event-vf witnessed-shocked-catheterization  ? GERD (gastroesophageal reflux disease)   ? rarely  ? H/O dysmenorrhea 2005  ? Hip pain 2008  ? Hx of metrorrhagia 2005  ? Hypertension   ? Norvasc  ? ? ?Past Surgical History:  ?Procedure Laterality Date  ? APPENDECTOMY    ? heart cathetherization  2010  ? SHOULDER SURGERY  2012  ? [facility]  ? TONSILLECTOMY    ? ? ?There were no vitals filed for this visit. ? ? Subjective Assessment - 09/21/21 1711   ? ? Subjective Pt has been back to work since 09/06/21. She notes intermittent completion of HEP since back to work. Pain in shoulder and lower back seems to have gotten a little worse since being back at work.   ? Patient is accompained by: Interpreter   ? Pertinent History HTN   ? Diagnostic tests CT/XR from ER   ? Currently in Pain? Yes   ? Pain Score 5    ? Pain Orientation Right;Upper   ? Pain Descriptors / Indicators Burning   ? Pain Type Acute pain   ? Pain Frequency Intermittent   ? Multiple Pain Sites Yes   ? Pain Score 5   worse at night  ? Pain Location Back   ? Pain Orientation Lower   ? Pain Descriptors / Indicators Other (Comment)   "stinging"  ? Pain Type  Chronic pain   ? Pain Radiating Towards intermittent pain down her legs to bottom of her feet with increasing LE weakness noted   ? Pain Frequency Intermittent   ? Aggravating Factors  laying down in bed   ? ?  ?  ? ?  ? ? ? ? ? [facility] PT Assessment - 09/21/21 1708   ? ?  ? Assessment  ? Medical Diagnosis strain of neck muscle; Rt RC strain   ? Referring Provider (PT) Clearance Coots MD   ? Onset Date/Surgical Date 07/10/21   ? Next MD Visit none currently scheduled   ?  ? Prior Function  ? Level of Independence Independent   ? Vocation Full time employment;Part time employment   ? Vocation Requirements standing at computer   ?  ? AROM  ? Right Shoulder Flexion 154 Degrees   ? Right Shoulder ABduction 159 Degrees   ? Right Shoulder Internal Rotation 78 Degrees   ? Right Shoulder External Rotation 90 Degrees   ? Cervical Flexion full   ? Cervical Extension full   ? Cervical - Right Side Bend 35   ? Cervical - Left Side Bend  30   ? Cervical - Right Rotation 68   ? Cervical - Left Rotation 57   ? ?  ?  ? ?  ? ? ? ? ? ? ? ? ? ? ? ? ? ? ? ? [facility] Adult PT Treatment/Exercise - 09/21/21 1708   ? ?  ? Self-Care  ? Self-Care Posture   ? Posture Discussed work station set-up at pt reports she has to stand at her computer all shift. Pt instructed to adjust keyboard height to just below elbow height in full upright stance to avoid shrugging or hiking shoulders, while adjusting monitor such that top edge is at eye level to avoid ducking/stooping down to see screen. Instructions also provided for scapular and abdominal muscle activation to promote improved muscular support to maintain neutral spinal alignment.   ?  ? Neck Exercises: Machines for Strengthening  ? UBE (Upper Arm Bike) L3.0 x 6 min (3' each fwd & back)   ?  ? Neck Exercises: Stretches  ? Upper Trapezius Stretch Right;2 reps;30 seconds   ? Levator Stretch Right;2 reps;30 seconds   ? Levator Stretch Limitations hand under posterior hip to retract & depress scapula   ?  Other Neck Stretches R ant/mid/post scalenes stretches with towel over shoulder 3 x 30 sec   ? Other Neck Stretches Thoracic extension over back of chair + pec stretch with hands clasped behind neck 10 x 3-5"   ?  ? Shoulder Exercises: Standing  ? Horizontal ABduction Both;10 reps;Theraband;Strengthening   ? Theraband Level (Shoulder Horizontal ABduction) Level 3 (Green)   ? Horizontal ABduction Limitations back along doorframe to promote scap retraction   ? External Rotation Both;10 reps;Strengthening;Theraband   ? Theraband Level (Shoulder External Rotation) Level 3 (Green)   ? External Rotation Limitations cues to keep elbows tucked in at side; back along doorframe to promote scap retraction   ? Extension Both;10 reps;Strengthening;Theraband   2 sets  ? Theraband Level (Shoulder Extension) Level 3 (Green)   ? Extension Limitations cues for scap retraction with slight long arm shoulder ER for increased scapular engagement   ? Row Both;10 reps;Strengthening;Theraband   2 sets  ? Theraband Level (Shoulder Row) Level 3 (Green)   ? Row Limitations cues for scap retraction with slight shoulder ER for increased scapular engagement   ? ?  ?  ? ?  ? ? ? ? ? ? ? ? ? ? ? ? PT Short Term Goals - 08/16/21 0941   ? ?  ? PT SHORT TERM GOAL #1  ? Title Ind with initial HEP   ? Status Achieved   08/12/21  ?  ? PT SHORT TERM GOAL #2  ? Title Patient to report decreased neck and right shoulder pain by 50% with ADLS   ? Status Achieved   08/12/21 - Pt reports 70% improvement in pain  ?  ? PT SHORT TERM GOAL #3  ? Title Patient to understand and demonstrate the importance of correct posture in decreasing and preventing further pain.   ? Status Achieved   08/16/21  ? ?  ?  ? ?  ? ? ? ? PT Long Term Goals - 09/21/21 1745   ? ?  ? PT LONG TERM GOAL #1  ? Title ind with advanced HEP   ? Status Partially Met   08/19/21 - met for current HEP  ? Target Date 10/07/21   ?  ? PT LONG TERM GOAL #2  ? Title Patient to demonstrate  full functional neck  ROM with 1/10 pain or less   ? Status Partially Met   08/26/21- R side bend 4/10 pain, more tight with R SB and rot  ? Target Date 10/07/21   ?  ? PT LONG TERM GOAL #3  ? Title Patient to demo full functional right shoulder ROM to allow her to perform OH ADLS and don/doff clothing.   ? Status Achieved   08/19/21  ?  ? PT LONG TERM GOAL #4  ? Title Patient able to perform ADLS with >= 4+/5 R UE strength and pain at 1/10 or less   ? Status Partially Met   08/26/21- 3/10 pain with MMT but 4+/5 in R shoulder  ? Target Date 10/07/21   ?  ? PT LONG TERM GOAL #5  ? Title Patient able to sleep without waking from pain   ? Status Achieved   08/19/21 - pt reports pain no longer wakes her up  ? ?  ?  ? ?  ? ? ? ? ? ? ? ? Plan - 09/21/21 1756   ? ? Clinical Impression Statement Kealie reports her R shoulder and low back pain seem to have worsened since returning to work on 09/06/21. R shoulder pain currently mostly in upper shoulder/lower neck heading toward upper scapula. She does admit to limited/intermittent performance of HEP and requested review today with cues and/or repeat instruction necessary with several exercises. Pt observed to be sitting in slouched position during warm-up and again while going over HEP, therefore reviewed need for consistent postural awareness with good activation of periscapular and abdominal muscles both in sitting and standing. We reviewed ideal workstation set-up as pt reports her job requires her to stand at her computer - pt instructed to adjust keyboard height to just below elbow height in full upright stance to avoid shrugging or hiking shoulders, while adjusting monitor such that top edge is at eye level to avoid ducking/stooping down to see screen. R shoulder AROM now essentially WNL and cervical ROM WFL although some limitation/variations still evident with side bending and rotation to L vs R.   ? Rehab Potential Excellent   ? PT Frequency 2x / week   ? PT Duration 6 weeks   ? PT  Treatment/Interventions ADLs/Self Care Home Management;Cryotherapy;Electrical Stimulation;Iontophoresis 83m/ml Dexamethasone;Moist Heat;Neuromuscular re-education;Therapeutic exercise;Therapeutic activities;Patient/famil

## 2021-09-27 ENCOUNTER — Ambulatory Visit: Payer: BC Managed Care – PPO

## 2021-09-27 ENCOUNTER — Other Ambulatory Visit: Payer: Self-pay

## 2021-09-27 DIAGNOSIS — M542 Cervicalgia: Secondary | ICD-10-CM

## 2021-09-27 DIAGNOSIS — M62838 Other muscle spasm: Secondary | ICD-10-CM

## 2021-09-27 DIAGNOSIS — M25511 Pain in right shoulder: Secondary | ICD-10-CM | POA: Diagnosis not present

## 2021-09-27 DIAGNOSIS — M25611 Stiffness of right shoulder, not elsewhere classified: Secondary | ICD-10-CM

## 2021-09-27 DIAGNOSIS — M6281 Muscle weakness (generalized): Secondary | ICD-10-CM

## 2021-09-27 NOTE — Therapy (Signed)
?[facility] ?[place] ?[place], [place], [address] ?Phone: [phone]   Fax:  [phone] ? ?Physical Therapy Treatment ? ?Patient Details  ?Name: Dawn Harrington ?MRN: [id number] ?Date of Birth: Aug 31, 1960 ?Referring Provider (PT): Clearance Coots MD ? ? ?Encounter Date: 09/27/2021 ? ? PT End of Session - 09/27/21 1753   ? ? Visit Number 13   ? Date for PT Re-Evaluation 10/07/21   ? Authorization Type BCBS   ? PT Start Time 1702   ? PT Stop Time 5329   ? PT Time Calculation (min) 41 min   ? Activity Tolerance Patient tolerated treatment well   ? Behavior During Therapy [facility] for tasks assessed/performed   ? ?  ?  ? ?  ? ? ?Past Medical History:  ?Diagnosis Date  ? Abnormal menstrual cycle 2006  ? Condyloma 03/2005  ? Dysrhythmia 2010  ? syncopal event-vf witnessed-shocked-catheterization  ? GERD (gastroesophageal reflux disease)   ? rarely  ? H/O dysmenorrhea 2005  ? Hip pain 2008  ? Hx of metrorrhagia 2005  ? Hypertension   ? Norvasc  ? ? ?Past Surgical History:  ?Procedure Laterality Date  ? APPENDECTOMY    ? heart cathetherization  2010  ? SHOULDER SURGERY  2012  ? [facility]  ? TONSILLECTOMY    ? ? ?There were no vitals filed for this visit. ? ? Subjective Assessment - 09/27/21 1703   ? ? Subjective Pt notes 7/10 pain when working at desk, pt notes sometime she does exercises and sometimes not.   ? Patient is accompained by: Interpreter   ? Pertinent History HTN   ? Diagnostic tests CT/XR from ER   ? Currently in Pain? Yes   ? Pain Score 4    7/10 pain when working  ? Pain Location Shoulder   ? Pain Orientation Right   ? Pain Descriptors / Indicators Burning   ? Pain Type Acute pain   ? ?  ?  ? ?  ? ? ? ? ? ? ? ? ? ? ? ? ? ? ? ? ? ? ? ? [facility] Adult PT Treatment/Exercise - 09/27/21 0001   ? ?  ? Neck Exercises: Machines for Strengthening  ? UBE (Upper Arm Bike) L3.0 x 6 min (3' each fwd & back)   ? Cybex Row 35lb 2x10, elbows to midline   ? Lat Pull  standing extension 10lb 2x10   ?  ? Neck Exercises: Stretches  ? Levator Stretch Right;Left;30 seconds;2 reps   ? Other Neck Stretches Thoracic extension over back of chair + pec stretch with hands clasped behind neck 20 x 3"   ?  ? Shoulder Exercises: Standing  ? Horizontal ABduction Both;10 reps;Theraband;Strengthening   2 sets  ? Theraband Level (Shoulder Horizontal ABduction) Level 3 (Green)   ? Horizontal ABduction Limitations back along doorframe to promote scap retraction   ? External Rotation Both;10 reps;Strengthening;Theraband   2 sets  ? Theraband Level (Shoulder External Rotation) Level 3 (Green)   ? External Rotation Limitations back along doorframe to promote scap retraction   ? Diagonals Both;10 reps;Strengthening;Theraband   ? Theraband Level (Shoulder Diagonals) Level 3 (Green)   ? Diagonals Limitations D1 flex both ways   ? ?  ?  ? ?  ? ? ? ? ? ? ? ? ? ? ? ? PT Short Term Goals - 08/16/21 0941   ? ?  ? PT SHORT TERM GOAL #1  ?  Title Ind with initial HEP   ? Status Achieved   08/12/21  ?  ? PT SHORT TERM GOAL #2  ? Title Patient to report decreased neck and right shoulder pain by 50% with ADLS   ? Status Achieved   08/12/21 - Pt reports 70% improvement in pain  ?  ? PT SHORT TERM GOAL #3  ? Title Patient to understand and demonstrate the importance of correct posture in decreasing and preventing further pain.   ? Status Achieved   08/16/21  ? ?  ?  ? ?  ? ? ? ? PT Long Term Goals - 09/21/21 1745   ? ?  ? PT LONG TERM GOAL #1  ? Title ind with advanced HEP   ? Status Partially Met   08/19/21 - met for current HEP  ? Target Date 10/07/21   ?  ? PT LONG TERM GOAL #2  ? Title Patient to demonstrate full functional neck ROM with 1/10 pain or less   ? Status Partially Met   08/26/21- R side bend 4/10 pain, more tight with R SB and rot  ? Target Date 10/07/21   ?  ? PT LONG TERM GOAL #3  ? Title Patient to demo full functional right shoulder ROM to allow her to perform OH ADLS and don/doff clothing.   ? Status  Achieved   08/19/21  ?  ? PT LONG TERM GOAL #4  ? Title Patient able to perform ADLS with >= 4+/5 R UE strength and pain at 1/10 or less   ? Status Partially Met   08/26/21- 3/10 pain with MMT but 4+/5 in R shoulder  ? Target Date 10/07/21   ?  ? PT LONG TERM GOAL #5  ? Title Patient able to sleep without waking from pain   ? Status Achieved   08/19/21 - pt reports pain no longer wakes her up  ? ?  ?  ? ?  ? ? ? ? ? ? ? ? Plan - 09/27/21 1754   ? ? Clinical Impression Statement Pt responded well to treatment. Reinforced importance of adjusting work equipment: (keyboards and computer screen) to reduce strain on the cervical spine. Cuing needed with all exercises to prevent compensation and to target the correct muscles. Added levator scapulae stretch to HEP to improve ROM with cervical rotation to the L. Pt w/o any increased pain post session.   ? Rehab Potential Excellent   ? PT Frequency 2x / week   ? PT Duration 6 weeks   ? PT Treatment/Interventions ADLs/Self Care Home Management;Cryotherapy;Electrical Stimulation;Iontophoresis 77m/ml Dexamethasone;Moist Heat;Neuromuscular re-education;Therapeutic exercise;Therapeutic activities;Patient/family education;Manual techniques;Dry needling;Taping;Spinal Manipulations;Joint Manipulations   ? PT Next Visit Plan thoracic mobility; periscap strengthening; QL release right; STM +/- DN as indicated and benefit noted to right biceps/triceps/neck/scapular muscles prn; cont estim prn   ? PT Home Exercise Plan [id number]   ? Consulted and Agree with Plan of Care Patient   ? ?  ?  ? ?  ? ? ?Patient will benefit from skilled therapeutic intervention in order to improve the following deficits and impairments:  Decreased range of motion, Pain, Impaired UE functional use, Increased muscle spasms, Impaired flexibility, Postural dysfunction, Decreased strength ? ?Visit Diagnosis: ?Acute pain of right shoulder ? ?Stiffness of right shoulder, not elsewhere classified ? ?Cervicalgia ? ?Other  muscle spasm ? ?Muscle weakness (generalized) ? ? ? ? ?Problem List ?Patient Active Problem List  ? Diagnosis Date Noted  ? Rotator cuff strain, right, initial  encounter 07/14/2021  ? Cervical strain 07/14/2021  ? Chest pain 04/04/2018  ? Hypertension 04/04/2018  ? GERD (gastroesophageal reflux disease) 04/04/2018  ? ? ?Dawn Harrington, Dawn Harrington ?09/27/2021, 6:04 PM ? ?[place] ?[facility] ?[place] ?[place], [place], [address] ?Phone: [phone]   Fax:  [phone] ? ?Name: Dawn Harrington ?MRN: [id number] ?Date of Birth: [birth year] ? ? ? ?

## 2021-09-30 ENCOUNTER — Ambulatory Visit: Payer: BC Managed Care – PPO | Admitting: Physical Therapy

## 2021-09-30 ENCOUNTER — Encounter: Payer: Self-pay | Admitting: Physical Therapy

## 2021-09-30 DIAGNOSIS — M6281 Muscle weakness (generalized): Secondary | ICD-10-CM

## 2021-09-30 DIAGNOSIS — M25511 Pain in right shoulder: Secondary | ICD-10-CM | POA: Diagnosis not present

## 2021-09-30 DIAGNOSIS — M542 Cervicalgia: Secondary | ICD-10-CM

## 2021-09-30 DIAGNOSIS — M62838 Other muscle spasm: Secondary | ICD-10-CM

## 2021-09-30 DIAGNOSIS — M25611 Stiffness of right shoulder, not elsewhere classified: Secondary | ICD-10-CM

## 2021-09-30 NOTE — Therapy (Signed)
[place] ?[facility] ?[place] ?[place], [place], [address] ?Phone: [phone]   Fax:  [phone] ? ?Physical Therapy Treatment / Recert ? ?Patient Details  ?Name: Dawn Harrington ?MRN: [id number] ?Date of Birth: 12-06-1960 ?Referring Provider (PT): Clearance Coots MD ? ? ? ?Progress Note ? ?Reporting Period 08/26/2021 to 09/30/2021 ? ?See note below for Objective Data and Assessment of Progress/Goals.  ? ? ? ?Encounter Date: 09/30/2021 ? ? PT End of Session - 09/30/21 1659   ? ? Visit Number 14   ? Date for PT Re-Evaluation 10/14/21   ? Authorization Type BCBS   ? PT Start Time 6962   ? PT Stop Time 9528   ? PT Time Calculation (min) 63 min   ? Activity Tolerance Patient tolerated treatment well   ? Behavior During Therapy [facility] for tasks assessed/performed   ? ?  ?  ? ?  ? ? ?Past Medical History:  ?Diagnosis Date  ? Abnormal menstrual cycle 2006  ? Condyloma 03/2005  ? Dysrhythmia 2010  ? syncopal event-vf witnessed-shocked-catheterization  ? GERD (gastroesophageal reflux disease)   ? rarely  ? H/O dysmenorrhea 2005  ? Hip pain 2008  ? Hx of metrorrhagia 2005  ? Hypertension   ? Norvasc  ? ? ?Past Surgical History:  ?Procedure Laterality Date  ? APPENDECTOMY    ? heart cathetherization  2010  ? SHOULDER SURGERY  2012  ? [facility]  ? TONSILLECTOMY    ? ? ?There were no vitals filed for this visit. ? ? Subjective Assessment - 09/30/21 1702   ? ? Subjective Pt reports she was layed off yesterday so longer being bothered by the desk set-up at work.   ? Patient is accompained by: Interpreter   ? Pertinent History HTN   ? Diagnostic tests CT/XR from ER   ? Currently in Pain? Yes   ? Pain Score 3    ? Pain Location Shoulder   ? Pain Orientation Right;Posterior   ? Pain Descriptors / Indicators Pressure;Tightness   "pricking"  ? Pain Type Acute pain   ? ?  ?  ? ?  ? ? ? ? ? [facility] PT Assessment - 09/30/21 1659   ? ?  ? Assessment  ? Medical Diagnosis strain of neck muscle;  Rt RC strain   ? Referring Provider (PT) Clearance Coots MD   ? Onset Date/Surgical Date 07/10/21   ? Next MD Visit none currently scheduled   ?  ? Prior Function  ? Level of Independence Independent   ? Vocation Full time employment;Part time employment   ? Vocation Requirements standing at computer   ?  ? Posture/Postural Control  ? Posture/Postural Control Postural limitations   ? Postural Limitations Rounded Shoulders;Forward head;Increased thoracic kyphosis   ?  ? AROM  ? Right Shoulder Flexion 156 Degrees   ? Right Shoulder ABduction 157 Degrees   ? Right Shoulder Internal Rotation 78 Degrees   ? Right Shoulder External Rotation 90 Degrees   ? Cervical Flexion full   ? Cervical Extension full   ? Cervical - Right Side Bend 40   ? Cervical - Left Side Bend 31   ? Cervical - Right Rotation 60   ? Cervical - Left Rotation 57   ?  ? Strength  ? Right Shoulder Flexion 5/5   ? Right Shoulder ABduction 5/5   ? Right Shoulder Internal Rotation 5/5   ? Right Shoulder External Rotation 4+/5  mild pain  ? ?  ?  ? ?  ? ? ? ? ? ? ? ? ? ? ? ? ? ? ? ? [facility] Adult PT Treatment/Exercise - 09/30/21 1659   ? ?  ? Neck Exercises: Machines for Strengthening  ? UBE (Upper Arm Bike) L3.0 x 6 min (3' each fwd & back)   ?  ? Manual Therapy  ? Manual Therapy Soft tissue mobilization;Myofascial release   ? Manual therapy comments skilled palpation and monitoring of soft tissue during DN   ? Soft tissue mobilization STM/DTM to R UT, LS, teres group, and deltoids   ? Myofascial Release manual TPR to R UT, LS, teres group and posterolateral deltoids   ? ?  ?  ? ?  ? ? ? [place] Dry Needling - 09/30/21 1659   ? ? Consent Given? Yes   ? Muscles Treated Head and Neck Upper trapezius;Levator scapulae   ? Muscles Treated Upper Quadrant Deltoid;Teres major;Teres minor   ? Dry Needling Comments Right   ? Upper Trapezius Response Twitch reponse elicited;Palpable increased muscle length   ? Levator Scapulae Response Twitch response  elicited;Palpable increased muscle length   ? Deltoid Response Twitch response elicited;Palpable increased muscle length   posterior  ? Teres major Response Twitch response elicited;Palpable increased muscle length   ? Teres minor Response Twitch response elicited;Palpable increased muscle length   ? ?  ?  ? ?  ? ? ? ? ? ? ? ? PT Education - 09/30/21 1800   ? ? Education Details HEP reviewed and reordered today, clarifying purpose of each exercise along with prescribed reps/sets and frequency, and reinforcing need for consistent compliance with HEP and postural awareness at home; addressed pt's questions regarding use of postural braces, recommending more of a posture reminder rather than a brace   ? Person(s) Educated Patient   ? Methods Explanation;Demonstration;Verbal cues;Handout   ? Comprehension Verbalized understanding;Verbal cues required;Need further instruction   ? ?  ?  ? ?  ? ? ? PT Short Term Goals - 08/16/21 0941   ? ?  ? PT SHORT TERM GOAL #1  ? Title Ind with initial HEP   ? Status Achieved   08/12/21  ?  ? PT SHORT TERM GOAL #2  ? Title Patient to report decreased neck and right shoulder pain by 50% with ADLS   ? Status Achieved   08/12/21 - Pt reports 70% improvement in pain  ?  ? PT SHORT TERM GOAL #3  ? Title Patient to understand and demonstrate the importance of correct posture in decreasing and preventing further pain.   ? Status Achieved   08/16/21  ? ?  ?  ? ?  ? ? ? ? PT Long Term Goals - 09/30/21 1711   ? ?  ? PT LONG TERM GOAL #1  ? Title ind with advanced HEP   ? Status Partially Met   09/30/21 - pt admits to selective completion of HEP with no reason given when questioned as to why she is not doing the remaining exercises - HEP instructions reviewed today clarifying purpose of each exercise and prescribed reps/sets and frequency  ? Target Date 10/14/21   ?  ? PT LONG TERM GOAL #2  ? Title Patient to demonstrate full functional neck ROM with 1/10 pain or less   ? Status Partially Met   09/30/21  - pain typically 5-3/61 with certain motions but pt unable to identify which motions of the  neck are most painful  ? Target Date 10/14/21   ?  ? PT LONG TERM GOAL #3  ? Title Patient to demo full functional right shoulder ROM to allow her to perform OH ADLS and don/doff clothing.   ? Status Achieved   08/19/21  ?  ? PT LONG TERM GOAL #4  ? Title Patient able to perform ADLS with >= 4+/5 R UE strength and pain at 1/10 or less   ? Status Partially Met   09/30/21 - all R shoulder MMT 5/5 except ER 4+/5 with pain still reported with resisted ER  ? Target Date 10/14/21   ?  ? PT LONG TERM GOAL #5  ? Title Patient able to sleep without waking from pain   ? Status Partially Met   09/30/21 - pt reports pain still occasionally wakes her up but as of 08/19/21 she had stated that pain no longer wakes her up  ? Target Date 10/14/21   ? ?  ?  ? ?  ? ? ? ? ? ? ? ? Plan - 09/30/21 1802   ? ? Clinical Impression Statement Dawn Harrington reports she was laid off as of yesterday and will know officially if she will be permanently fired as of 10/11/21. Less pain today as she did not have to stand at the work desk today. She continues to demonstrate poor posture with forward head and rounded shoulders even when reminded t/o session. She inquired about getting a brace to help with her posture - PT discouraged dependence on a brace as it would likely lead to increased postural weakness from disuse, instead encouraging more of a posture reminder that would alert her to reinforce better posture if she is intent on something to help with her posture. She admits to selective completion of HEP with no reason given when questioned as to why she is not doing the remaining exercises, therefore HEP instructions reviewed and reordered today, clarifying purpose of each exercise along with prescribed reps/sets and frequency. Her cervical ROM continues to fluctuate with pain reports also varying. R shoulder strength has improved to 5/5 for all motions except ER  4+/5 with slight pain still reported with ER. She continues to report posterior R shoulder and lower neck tightness and tenderness with a few taut bands/TPs still present - addressed with MT incorporating DN and

## 2021-09-30 NOTE — Patient Instructions (Signed)
Access Code: [email or web address] ?URL: [email or web address] ?Date: 09/30/2021 ?Prepared by: Glenetta Hew ? ?Exercises ?- Seated Scalene Stretch with Towel  - 1 x daily - 7 x weekly - 3 reps - 30 sec hold ?- Seated Gentle Upper Trapezius Stretch  - 2 x daily - 7 x weekly - 2 sets - 2 reps - 30 second hold ?- Gentle Levator Scapulae Stretch  - 2 x daily - 7 x weekly - 2 sets - 2 reps - 30 sec hold ?- Seated Thoracic Lumbar Extension with Pectoralis Stretch  - 1 x daily - 7 x weekly - 2 sets - 10 reps - 3 sec hold ?- Seated Assisted Cervical Rotation with Towel  - 1 x daily - 7 x weekly - 2 sets - 10 reps - 3 sec hold ?- Seated Assisted Cervical Rotation with Towel (Mirrored)  - 1 x daily - 7 x weekly - 2 sets - 10 reps - 3 sec hold ?- Seated Shoulder Horizontal Abduction with Resistance - Palms Down  - 1 x daily - 3-4 x weekly - 2 sets - 10 reps - 3-5 hold ?- Shoulder External Rotation and Scapular Retraction with Resistance  - 1 x daily - 3-4 x weekly - 2 sets - 10 reps - 3-5 sec hold ?- Standing Bilateral Low Shoulder Row with Anchored Resistance  - 1 x daily - 3-4 x weekly - 2 sets - 10 reps - 5 sec hold ?- Scapular Retraction with Resistance Advanced  - 1 x daily - 3-4 x weekly - 2 sets - 10 reps - 5 sec hold ? ?Patient Education ?- Trigger Point [employer] Needling ?- Drills for [employer] ?- Office Posture ?

## 2021-10-04 ENCOUNTER — Ambulatory Visit: Payer: BC Managed Care – PPO | Attending: Family Medicine

## 2021-10-04 DIAGNOSIS — M62838 Other muscle spasm: Secondary | ICD-10-CM | POA: Diagnosis present

## 2021-10-04 DIAGNOSIS — M25611 Stiffness of right shoulder, not elsewhere classified: Secondary | ICD-10-CM | POA: Insufficient documentation

## 2021-10-04 DIAGNOSIS — M542 Cervicalgia: Secondary | ICD-10-CM | POA: Insufficient documentation

## 2021-10-04 DIAGNOSIS — M6281 Muscle weakness (generalized): Secondary | ICD-10-CM | POA: Diagnosis present

## 2021-10-04 DIAGNOSIS — M25511 Pain in right shoulder: Secondary | ICD-10-CM | POA: Insufficient documentation

## 2021-10-04 NOTE — Therapy (Signed)
[place] ?[facility] ?[address] ?[place], [place], [address] ?Phone: [phone]   Fax:  [phone] ? ?Physical Therapy Treatment ? ?Patient Details  ?Name: Dawn Harrington ?MRN: [id number] ?Date of Birth: 25-Sep-1960 ?Referring Provider (PT): Clearance Coots MD ? ? ?Encounter Date: 10/04/2021 ? ? PT End of Session - 10/04/21 1800   ? ? Visit Number 15   ? Date for PT Re-Evaluation 10/14/21   ? Authorization Type BCBS   ? PT Start Time 8101   ? PT Stop Time 7510   ? PT Time Calculation (min) 42 min   ? Activity Tolerance Patient tolerated treatment well   ? Behavior During Therapy [facility] for tasks assessed/performed   ? ?  ?  ? ?  ? ? ?Past Medical History:  ?Diagnosis Date  ? Abnormal menstrual cycle 2006  ? Condyloma 03/2005  ? Dysrhythmia 2010  ? syncopal event-vf witnessed-shocked-catheterization  ? GERD (gastroesophageal reflux disease)   ? rarely  ? H/O dysmenorrhea 2005  ? Hip pain 2008  ? Hx of metrorrhagia 2005  ? Hypertension   ? Norvasc  ? ? ?Past Surgical History:  ?Procedure Laterality Date  ? APPENDECTOMY    ? heart cathetherization  2010  ? SHOULDER SURGERY  2012  ? [place]  ? TONSILLECTOMY    ? ? ?There were no vitals filed for this visit. ? ? Subjective Assessment - 10/04/21 1706   ? ? Subjective Pt reports pain yesterday from working long hours but none today, still confused about Dawn towel exercises   ? Patient is accompained by: Interpreter   ? Pertinent History HTN   ? Diagnostic tests CT/XR from ER   ? Currently in Pain? No/denies   ? ?  ?  ? ?  ? ? ? ? ? ? ? ? ? ? ? ? ? ? ? ? ? ? ? ? [place] Adult PT Treatment/Exercise - 10/04/21 0001   ? ?  ? Neck Exercises: Machines for Strengthening  ? UBE (Upper Arm Bike) L3.0 x 6 min (3' each fwd & back)   ? Cybex Row 25lb 20 reps; chest supported for periscap isolation   ?  ? Neck Exercises: Theraband  ? Shoulder External Rotation 15 reps;Green   2 sets; seated  ? Horizontal ABduction 15 reps;Green   2 sets;  seated  ?  ? Neck Exercises: Standing  ? Lift / Chop 10 reps;Theraband   ? Theraband Level (Lift/Chop) Level 3 (Green)   ? Left / Chop Limitations D1/D2 flexion x 10 each direction   ?  ? Neck Exercises: Seated  ? Other Seated Exercise SNAGs into rotation R/L x 10   ?  ? Neck Exercises: Stretches  ? Other Neck Stretches thoracic rotation while supported on countertop x 10 bil   ? ?  ?  ? ?  ? ? ? ? ? ? ? ? ? ? PT Education - 10/04/21 1759   ? ? Education Details HEP update: reverse chop, reviewed AA cervical rotation   ? Person(s) Educated Patient   ? Methods Explanation;Demonstration;Tactile cues;Verbal cues;Handout   ? Comprehension Verbalized understanding;Returned demonstration;Verbal cues required;Tactile cues required;Need further instruction   ? ?  ?  ? ?  ? ? ? PT Short Term Goals - 08/16/21 0941   ? ?  ? PT SHORT TERM GOAL #1  ? Title Ind with initial HEP   ? Status Achieved   08/12/21  ?  ?  PT SHORT TERM GOAL #2  ? Title Patient to report decreased neck and right shoulder pain by 50% with ADLS   ? Status Achieved   08/12/21 - Pt reports 70% improvement in pain  ?  ? PT SHORT TERM GOAL #3  ? Title Patient to understand and demonstrate Dawn importance of correct posture in decreasing and preventing further pain.   ? Status Achieved   08/16/21  ? ?  ?  ? ?  ? ? ? ? PT Long Term Goals - 10/04/21 1801   ? ?  ? PT LONG TERM GOAL #1  ? Title ind with advanced HEP   ? Status Partially Met   09/30/21 - pt admits to selective completion of HEP with no reason given when questioned as to why she is not doing Dawn remaining exercises - HEP instructions reviewed today clarifying purpose of each exercise and prescribed reps/sets and frequency  ? Target Date 10/14/21   ?  ? PT LONG TERM GOAL #2  ? Title Patient to demonstrate full functional neck ROM with 1/10 pain or less   ? Status Partially Met   09/30/21 - pain typically 1-1/57 with certain motions but pt unable to identify which motions of Dawn neck are most painful  ? Target  Date 10/14/21   ?  ? PT LONG TERM GOAL #3  ? Title Patient to demo full functional right shoulder ROM to allow her to perform OH ADLS and don/doff clothing.   ? Status Achieved   08/19/21  ?  ? PT LONG TERM GOAL #4  ? Title Patient able to perform ADLS with >= 4+/5 R UE strength and pain at 1/10 or less   ? Status Partially Met   09/30/21 - all R shoulder MMT 5/5 except ER 4+/5 with pain still reported with resisted ER  ? Target Date 10/14/21   ?  ? PT LONG TERM GOAL #5  ? Title Patient able to sleep without waking from pain   ? Status Partially Met   10/04/21- no paint reported over last week with sleep except for last night  ? Target Date 10/14/21   ? ?  ?  ? ?  ? ? ? ? ? ? ? ? Plan - 10/04/21 1803   ? ? Clinical Impression Statement Pt noting today that she had less occurences of pain waking her up at night over Dawn past week, however last night due to increase in activity she was having some night pain. Clarified active assistive cervical rotation with cuing given and advised to do with pillowcase as this is more comfortable for her. Throughout session with assistance from interpreter guided pt through session and provided instruction on proper movement patterns with exercises. Added reverse chop to HEP to incorporate more trunk movement to ease mobility. Pt still tends to protract scapulae at rest and requires instruction to increase retraction for better postural alignment.   ? PT Frequency 2x / week   ? PT Duration 2 weeks   ? PT Treatment/Interventions ADLs/Self Care Home Management;Cryotherapy;Electrical Stimulation;Iontophoresis 67m/ml Dexamethasone;Moist Heat;Neuromuscular re-education;Therapeutic exercise;Therapeutic activities;Patient/family education;Manual techniques;Dry needling;Taping;Spinal Manipulations;Joint Manipulations   ? PT Next Visit Plan thoracic mobility; periscap and postural strengthening; STM +/- DN as indicated and benefit noted to posterior shoulder/scapular/neck/QL muscles prn; estim  prn   ? PT Home Exercise Plan [email or web address]   ? Consulted and Agree with Plan of Care Patient   ? ?  ?  ? ?  ? ? ?Patient will benefit  from skilled therapeutic intervention in order to improve Dawn following deficits and impairments:  Decreased range of motion, Pain, Impaired UE functional use, Increased muscle spasms, Impaired flexibility, Postural dysfunction, Decreased strength ? ?Visit Diagnosis: ?Acute pain of right shoulder ? ?Stiffness of right shoulder, not elsewhere classified ? ?Cervicalgia ? ?Other muscle spasm ? ?Muscle weakness (generalized) ? ? ? ? ?Problem List ?Patient Active Problem List  ? Diagnosis Date Noted  ? Rotator cuff strain, right, initial encounter 07/14/2021  ? Cervical strain 07/14/2021  ? Chest pain 04/04/2018  ? Hypertension 04/04/2018  ? GERD (gastroesophageal reflux disease) 04/04/2018  ? ? ?Artist Pais, PTA ?10/04/2021, 6:23 PM ? ?[place] ?[facility] ?[address] ?[place], [place], [address] ?Phone: [phone]   Fax:  [phone] ? ?Name: [facility] ?MRN: [id number] ?Date of Birth: 1960-09-17 ? ? ? ?

## 2021-10-04 NOTE — Patient Instructions (Signed)
Access Code: [email or web address] ?URL: [email or web address] ?Date: 10/04/2021 ?Prepared by: Clarene Essex ? ?Exercises ?- Seated Scalene Stretch with Towel  - 1 x daily - 7 x weekly - 3 reps - 30 sec hold ?- Seated Gentle Upper Trapezius Stretch  - 2 x daily - 7 x weekly - 2 sets - 2 reps - 30 second hold ?- Gentle Levator Scapulae Stretch  - 2 x daily - 7 x weekly - 2 sets - 2 reps - 30 sec hold ?- Seated Thoracic Lumbar Extension with Pectoralis Stretch  - 1 x daily - 7 x weekly - 2 sets - 10 reps - 3 sec hold ?- Seated Assisted Cervical Rotation with Towel  - 1 x daily - 7 x weekly - 2 sets - 10 reps - 3 sec hold ?- Seated Assisted Cervical Rotation with Towel (Mirrored)  - 1 x daily - 7 x weekly - 2 sets - 10 reps - 3 sec hold ?- Seated Shoulder Horizontal Abduction with Resistance - Palms Down  - 1 x daily - 3-4 x weekly - 2 sets - 10 reps - 3-5 hold ?- Shoulder External Rotation and Scapular Retraction with Resistance  - 1 x daily - 3-4 x weekly - 2 sets - 10 reps - 3-5 sec hold ?- Standing Bilateral Low Shoulder Row with Anchored Resistance  - 1 x daily - 3-4 x weekly - 2 sets - 10 reps - 5 sec hold ?- Scapular Retraction with Resistance Advanced  - 1 x daily - 3-4 x weekly - 2 sets - 10 reps - 5 sec hold ?- Reverse Chop with Resistance  - 1 x daily - 3-4 x weekly - 3 sets - 10 reps ? ?Patient Education ?- Trigger Point [employer] Needling ?- Drills for [employer] ?- Office Posture ?

## 2021-10-07 ENCOUNTER — Ambulatory Visit: Payer: BC Managed Care – PPO

## 2021-10-07 DIAGNOSIS — M25611 Stiffness of right shoulder, not elsewhere classified: Secondary | ICD-10-CM

## 2021-10-07 DIAGNOSIS — M62838 Other muscle spasm: Secondary | ICD-10-CM

## 2021-10-07 DIAGNOSIS — M25511 Pain in right shoulder: Secondary | ICD-10-CM

## 2021-10-07 DIAGNOSIS — M542 Cervicalgia: Secondary | ICD-10-CM

## 2021-10-07 DIAGNOSIS — M6281 Muscle weakness (generalized): Secondary | ICD-10-CM

## 2021-10-07 NOTE — Therapy (Signed)
[place] ?[facility] ?[address] ?[place], [place], [address] ?Phone: [phone]   Fax:  [phone] ? ?Physical Therapy Treatment ? ?Patient Details  ?Name: Dawn Harrington ?MRN: [id number] ?Date of Birth: 1961/05/09 ?Referring Provider (PT): Clearance Coots MD ? ? ?Encounter Date: 10/07/2021 ? ? PT End of Session - 10/07/21 1746   ? ? Visit Number 16   ? Date for PT Re-Evaluation 10/14/21   ? Authorization Type BCBS   ? PT Start Time 1703   ? PT Stop Time 1740   ? PT Time Calculation (min) 38 min   ? Activity Tolerance Patient tolerated treatment well   ? Behavior During Therapy [facility] for tasks assessed/performed   ? ?  ?  ? ?  ? ? ?Past Medical History:  ?Diagnosis Date  ? Abnormal menstrual cycle 2006  ? Condyloma 03/2005  ? Dysrhythmia 2010  ? syncopal event-vf witnessed-shocked-catheterization  ? GERD (gastroesophageal reflux disease)   ? rarely  ? H/O dysmenorrhea 2005  ? Hip pain 2008  ? Hx of metrorrhagia 2005  ? Hypertension   ? Norvasc  ? ? ?Past Surgical History:  ?Procedure Laterality Date  ? APPENDECTOMY    ? heart cathetherization  2010  ? SHOULDER SURGERY  2012  ? [place]  ? TONSILLECTOMY    ? ? ?There were no vitals filed for this visit. ? ? Subjective Assessment - 10/07/21 1703   ? ? Subjective Pt reports that she has been doing good because she has not been working.   ? Patient is accompained by: Interpreter   ? Pertinent History HTN   ? Diagnostic tests CT/XR from ER   ? Currently in Pain? No/denies   ? ?  ?  ? ?  ? ? ? ? ? ? ? ? ? ? ? ? ? ? ? ? ? ? ? ? [place] Adult PT Treatment/Exercise - 10/07/21 0001   ? ?  ? Neck Exercises: Machines for Strengthening  ? UBE (Upper Arm Bike) L3.0 x 6 min (3' each fwd & back)   ? Cybex Row 20lb high grips 2x15   ? Lat Pull 15lb 2x10 standing pull down   ?  ? Neck Exercises: Seated  ? Other Seated Exercise SNAGs into rotation R/L x 10   ?  ? Neck Exercises: Stretches  ? Levator Stretch Right;Left;1 rep;30 seconds    ?  ? Shoulder Exercises: Standing  ? Row Strengthening;Both;10 reps;Theraband   2 sets  ? Theraband Level (Shoulder Row) Level 3 (Green)   anchored low on door  ? Diagonals Both;10 reps;Strengthening;Theraband   ? Theraband Level (Shoulder Diagonals) Level 3 (Green)   ? Diagonals Limitations D1 flex both ways, w/o trunk rotation   ? Other Standing Exercises counter push ups x 10   ? ?  ?  ? ?  ? ? ? ? ? ? ? ? ? ? ? ? PT Short Term Goals - 08/16/21 0941   ? ?  ? PT SHORT TERM GOAL #1  ? Title Ind with initial HEP   ? Status Achieved   08/12/21  ?  ? PT SHORT TERM GOAL #2  ? Title Patient to report decreased neck and right shoulder pain by 50% with ADLS   ? Status Achieved   08/12/21 - Pt reports 70% improvement in pain  ?  ? PT SHORT TERM GOAL #3  ? Title Patient to understand and demonstrate the importance of correct  posture in decreasing and preventing further pain.   ? Status Achieved   08/16/21  ? ?  ?  ? ?  ? ? ? ? PT Long Term Goals - 10/07/21 1733   ? ?  ? PT LONG TERM GOAL #1  ? Title ind with advanced HEP   ? Status Partially Met   09/30/21 - pt admits to selective completion of HEP with no reason given when questioned as to why she is not doing the remaining exercises - HEP instructions reviewed today clarifying purpose of each exercise and prescribed reps/sets and frequency  ? Target Date 10/14/21   ?  ? PT LONG TERM GOAL #2  ? Title Patient to demonstrate full functional neck ROM with 1/10 pain or less   ? Status Partially Met   09/30/21 - pain typically 5-0/53 with certain motions but pt unable to identify which motions of the neck are most painful  ? Target Date 10/14/21   ?  ? PT LONG TERM GOAL #3  ? Title Patient to demo full functional right shoulder ROM to allow her to perform OH ADLS and don/doff clothing.   ? Status Achieved   08/19/21  ?  ? PT LONG TERM GOAL #4  ? Title Patient able to perform ADLS with >= 4+/5 R UE strength and pain at 1/10 or less   ? Status Partially Met   09/30/21 - all R shoulder  MMT 5/5 except ER 4+/5 with pain still reported with resisted ER  ? Target Date 10/14/21   ?  ? PT LONG TERM GOAL #5  ? Title Patient able to sleep without waking from pain   ? Status Partially Met   10/04/21- no paint reported over last week with sleep except for last night  ? Target Date 10/14/21   ? ?  ?  ? ?  ? ? ? ? ? ? ? ? Plan - 10/07/21 1747   ? ? Clinical Impression Statement Pt tolerated the progression of exercises well. No increased pain during session. Cuing required with counter pushups and diagonals. Reviewed cervical rot and levator stretch from HEP per pt request. She now reports being able to sleep with only 3/10 neck pain. Pt still onboard with finalizing HEP for D/C at end of POC.   ? PT Frequency 2x / week   ? PT Duration 2 weeks   ? PT Treatment/Interventions ADLs/Self Care Home Management;Cryotherapy;Electrical Stimulation;Iontophoresis 92m/ml Dexamethasone;Moist Heat;Neuromuscular re-education;Therapeutic exercise;Therapeutic activities;Patient/family education;Manual techniques;Dry needling;Taping;Spinal Manipulations;Joint Manipulations   ? PT Next Visit Plan thoracic mobility; periscap and postural strengthening; STM +/- DN as indicated and benefit noted to posterior shoulder/scapular/neck/QL muscles prn; estim prn   ? PT Home Exercise Plan [email or web address]   ? Consulted and Agree with Plan of Care Patient   ? ?  ?  ? ?  ? ? ?Patient will benefit from skilled therapeutic intervention in order to improve the following deficits and impairments:  Decreased range of motion, Pain, Impaired UE functional use, Increased muscle spasms, Impaired flexibility, Postural dysfunction, Decreased strength ? ?Visit Diagnosis: ?Acute pain of right shoulder ? ?Stiffness of right shoulder, not elsewhere classified ? ?Cervicalgia ? ?Other muscle spasm ? ?Muscle weakness (generalized) ? ? ? ? ?Problem List ?Patient Active Problem List  ? Diagnosis Date Noted  ? Rotator cuff strain, right, initial encounter 07/14/2021  ?  Cervical strain 07/14/2021  ? Chest pain 04/04/2018  ? Hypertension 04/04/2018  ? GERD (gastroesophageal reflux disease) 04/04/2018  ? ? ?Leaner Morici  Renaldo Harrison, PTA ?10/07/2021, 5:51 PM ? ?[place] ?[facility] ?[address] ?[place], [place], [address] ?Phone: [phone]   Fax:  [phone] ? ?Name: [facility] ?MRN: [id number] ?Date of Birth: December 18, 1960 ? ? ? ?

## 2021-10-11 ENCOUNTER — Ambulatory Visit: Payer: BC Managed Care – PPO

## 2021-10-11 DIAGNOSIS — M25511 Pain in right shoulder: Secondary | ICD-10-CM | POA: Diagnosis not present

## 2021-10-11 DIAGNOSIS — M62838 Other muscle spasm: Secondary | ICD-10-CM

## 2021-10-11 DIAGNOSIS — M542 Cervicalgia: Secondary | ICD-10-CM

## 2021-10-11 DIAGNOSIS — M25611 Stiffness of right shoulder, not elsewhere classified: Secondary | ICD-10-CM

## 2021-10-11 DIAGNOSIS — M6281 Muscle weakness (generalized): Secondary | ICD-10-CM

## 2021-10-11 NOTE — Therapy (Signed)
[place] ?[facility] ?[address] ?[place], [place], [address] ?Phone: [phone]   Fax:  [phone] ? ?Physical Therapy Treatment ? ?Patient Details  ?Name: [facility] ?MRN: [id number] ?Date of Birth: 05-23-1961 ?Referring Provider (PT): Clearance Coots MD ? ? ?Encounter Date: 10/11/2021 ? ? PT End of Session - 10/11/21 1803   ? ? Visit Number 17   ? Date for PT Re-Evaluation 10/14/21   ? Authorization Type BCBS   ? PT Start Time 7619   ? PT Stop Time 5093   ? PT Time Calculation (min) 39 min   ? Activity Tolerance Patient tolerated treatment well   ? Behavior During Therapy [facility] for tasks assessed/performed   ? ?  ?  ? ?  ? ? ?Past Medical History:  ?Diagnosis Date  ? Abnormal menstrual cycle 2006  ? Condyloma 03/2005  ? Dysrhythmia 2010  ? syncopal event-vf witnessed-shocked-catheterization  ? GERD (gastroesophageal reflux disease)   ? rarely  ? H/O dysmenorrhea 2005  ? Hip pain 2008  ? Hx of metrorrhagia 2005  ? Hypertension   ? Norvasc  ? ? ?Past Surgical History:  ?Procedure Laterality Date  ? APPENDECTOMY    ? heart cathetherization  2010  ? SHOULDER SURGERY  2012  ? [place]  ? TONSILLECTOMY    ? ? ?There were no vitals filed for this visit. ? ? Subjective Assessment - 10/11/21 1711   ? ? Subjective Pt reports pain aroundd 12 pm today and 3pm but none now.   ? Patient is accompained by: Interpreter   ? Pertinent History HTN   ? Diagnostic tests CT/XR from ER   ? Currently in Pain? No/denies   ? ?  ?  ? ?  ? ? ? ? ? ? ? ? ? ? ? ? ? ? ? ? ? ? ? ? [place] Adult PT Treatment/Exercise - 10/11/21 0001   ? ?  ? Neck Exercises: Machines for Strengthening  ? UBE (Upper Arm Bike) L3.0 x 6 min (3' each fwd & back)   ? Cybex Row 20lb high grips x20, 25lb x 10   ? Lat Pull 20lb x 20, standing   ?  ? Neck Exercises: Theraband  ? Shoulder Extension 10 reps;Green   2 set  ? Shoulder Extension Limitations ext + abd   ? Shoulder External Rotation 20 reps;Green   ?  Horizontal ABduction Red;10 reps   anchored in doorframe  ?  ? Neck Exercises: Standing  ? Other Standing Exercises cervical ext isometric with ball on wall x 10   ?  ? Neck Exercises: Seated  ? Other Seated Exercise cervical ext isometric with pillowcase x 10   ?  ? Shoulder Exercises: Standing  ? Flexion Strengthening;Both;10 reps;Weights   ? Shoulder Flexion Weight (lbs) 3   ? ABduction Strengthening;Both;10 reps;Weights   ? Shoulder ABduction Weight (lbs) 3   ? Diagonals Both;10 reps;Strengthening;Theraband   ? Theraband Level (Shoulder Diagonals) Level 3 (Green)   ? Diagonals Limitations D1/D2 flexion B UE 10 each   ? ?  ?  ? ?  ? ? ? ? ? ? ? ? ? ? ? ? PT Short Term Goals - 08/16/21 0941   ? ?  ? PT SHORT TERM GOAL #1  ? Title Ind with initial HEP   ? Status Achieved   08/12/21  ?  ? PT SHORT TERM GOAL #2  ? Title Patient to report decreased  neck and right shoulder pain by 50% with ADLS   ? Status Achieved   08/12/21 - Pt reports 70% improvement in pain  ?  ? PT SHORT TERM GOAL #3  ? Title Patient to understand and demonstrate the importance of correct posture in decreasing and preventing further pain.   ? Status Achieved   08/16/21  ? ?  ?  ? ?  ? ? ? ? PT Long Term Goals - 10/07/21 1733   ? ?  ? PT LONG TERM GOAL #1  ? Title ind with advanced HEP   ? Status Partially Met   09/30/21 - pt admits to selective completion of HEP with no reason given when questioned as to why she is not doing the remaining exercises - HEP instructions reviewed today clarifying purpose of each exercise and prescribed reps/sets and frequency  ? Target Date 10/14/21   ?  ? PT LONG TERM GOAL #2  ? Title Patient to demonstrate full functional neck ROM with 1/10 pain or less   ? Status Partially Met   09/30/21 - pain typically 0-6/23 with certain motions but pt unable to identify which motions of the neck are most painful  ? Target Date 10/14/21   ?  ? PT LONG TERM GOAL #3  ? Title Patient to demo full functional right shoulder ROM to allow her  to perform OH ADLS and don/doff clothing.   ? Status Achieved   08/19/21  ?  ? PT LONG TERM GOAL #4  ? Title Patient able to perform ADLS with >= 4+/5 R UE strength and pain at 1/10 or less   ? Status Partially Met   09/30/21 - all R shoulder MMT 5/5 except ER 4+/5 with pain still reported with resisted ER  ? Target Date 10/14/21   ?  ? PT LONG TERM GOAL #5  ? Title Patient able to sleep without waking from pain   ? Status Partially Met   10/04/21- no paint reported over last week with sleep except for last night  ? Target Date 10/14/21   ? ?  ?  ? ?  ? ? ? ? ? ? ? ? Plan - 10/11/21 1804   ? ? Clinical Impression Statement Pt continues to progress noting no instances of waking up due to neck pain, meeting one of her LTGs. Cuing required throughout session for form with exercises by demonstration and via interpreter. Pt still onboard with preparing to end current episode of PT, denies the need for HEP updates.   ? PT Frequency 2x / week   ? PT Duration 2 weeks   ? PT Treatment/Interventions ADLs/Self Care Home Management;Cryotherapy;Electrical Stimulation;Iontophoresis 76m/ml Dexamethasone;Moist Heat;Neuromuscular re-education;Therapeutic exercise;Therapeutic activities;Patient/family education;Manual techniques;Dry needling;Taping;Spinal Manipulations;Joint Manipulations   ? PT Next Visit Plan assess goals; review HEP if needed; prepare for D/C or hold from PT   ? [facility]  ? Consulted and Agree with Plan of Care Patient   ? ?  ?  ? ?  ? ? ?Patient will benefit from skilled therapeutic intervention in order to improve the following deficits and impairments:  Decreased range of motion, Pain, Impaired UE functional use, Increased muscle spasms, Impaired flexibility, Postural dysfunction, Decreased strength ? ?Visit Diagnosis: ?Acute pain of right shoulder ? ?Stiffness of right shoulder, not elsewhere classified ? ?Cervicalgia ? ?Other muscle spasm ? ?Muscle weakness (generalized) ? ? ? ? ?Problem  List ?Patient Active Problem List  ? Diagnosis Date Noted  ?  Rotator cuff strain, right, initial encounter 07/14/2021  ? Cervical strain 07/14/2021  ? Chest pain 04/04/2018  ? Hypertension 04/04/2018  ? GERD (gastroesophageal reflux disease) 04/04/2018  ? ? ?Artist Pais, PTA ?10/11/2021, 6:11 PM ? ?[place] ?[facility] ?[address] ?[place], [place], [address] ?Phone: [phone]   Fax:  [phone] ? ?Name: [facility] ?MRN: [id number] ?Date of Birth: 1961/03/19 ? ? ? ?

## 2021-10-14 ENCOUNTER — Ambulatory Visit: Payer: BC Managed Care – PPO | Admitting: Physical Therapy

## 2021-10-14 ENCOUNTER — Encounter: Payer: Self-pay | Admitting: Physical Therapy

## 2021-10-14 DIAGNOSIS — M25611 Stiffness of right shoulder, not elsewhere classified: Secondary | ICD-10-CM

## 2021-10-14 DIAGNOSIS — M25511 Pain in right shoulder: Secondary | ICD-10-CM | POA: Diagnosis not present

## 2021-10-14 DIAGNOSIS — M62838 Other muscle spasm: Secondary | ICD-10-CM

## 2021-10-14 DIAGNOSIS — M6281 Muscle weakness (generalized): Secondary | ICD-10-CM

## 2021-10-14 DIAGNOSIS — M542 Cervicalgia: Secondary | ICD-10-CM

## 2021-10-14 NOTE — Therapy (Signed)
[facility] ?[place] ?[place], [place], [address] ?Phone: [phone]   Fax:  [phone] ? ?Physical Therapy Treatment / Discharge Summary ? ?Patient Details  ?Name: Dawn Harrington ?MRN: [id number] ?Date of Birth: [birth year] ?Referring Provider (PT): Clearance Coots MD ? ? ?Encounter Date: 10/14/2021 ? ? PT End of Session - 10/14/21 1708   ? ? Visit Number 18   ? Date for PT Re-Evaluation 10/14/21   ? Authorization Type BCBS   ? PT Start Time 1708   ? PT Stop Time 1735   ? PT Time Calculation (min) 27 min   ? Activity Tolerance Patient tolerated treatment well   ? Behavior During Therapy [name] for tasks assessed/performed   ? ?  ?  ? ?  ? ? ?Past Medical History:  ?Diagnosis Date  ? Abnormal menstrual cycle 2006  ? Condyloma 03/2005  ? Dysrhythmia 2010  ? syncopal event-vf witnessed-shocked-catheterization  ? GERD (gastroesophageal reflux disease)   ? rarely  ? H/O dysmenorrhea 2005  ? Hip pain 2008  ? Hx of metrorrhagia 2005  ? Hypertension   ? Norvasc  ? ? ?Past Surgical History:  ?Procedure Laterality Date  ? APPENDECTOMY    ? heart cathetherization  2010  ? SHOULDER SURGERY  2012  ? [facility]  ? TONSILLECTOMY    ? ? ?There were no vitals filed for this visit. ? ? Subjective Assessment - 10/14/21 1708   ? ? Subjective Pt denies pain today. No questions following the HEP review last visit.   ? Patient is accompained by: Interpreter   ? Pertinent History HTN   ? Diagnostic tests CT/XR from ER   ? Currently in Pain? No/denies   ? ?  ?  ? ?  ? ? ? ? ? [facility] PT Assessment - 10/14/21 1708   ? ?  ? Assessment  ? Medical Diagnosis strain of neck muscle; Rt RC strain   ? Referring Provider (PT) Clearance Coots MD   ? Onset Date/Surgical Date 07/10/21   ? Next MD Visit none currently scheduled   ?  ? AROM  ? Right Shoulder Flexion 167 Degrees   ? Right Shoulder ABduction 159 Degrees   ? Right Shoulder Internal Rotation 82 Degrees   FIR WNL  ? Right Shoulder  External Rotation 101 Degrees   FER WNL  ? Cervical Flexion full   ? Cervical Extension full   ? Cervical - Right Side Bend 40   ? Cervical - Left Side Bend 38   ? Cervical - Right Rotation 69   ? Cervical - Left Rotation 71   ?  ? Strength  ? Right Shoulder Flexion 5/5   ? Right Shoulder ABduction 5/5   ? Right Shoulder Internal Rotation 5/5   ? Right Shoulder External Rotation 5/5   ? ?  ?  ? ?  ? ? ? ? ? ? ? ? ? ? ? ? ? ? ? ? [facility] Adult PT Treatment/Exercise - 10/14/21 1708   ? ?  ? Neck Exercises: Machines for Strengthening  ? UBE (Upper Arm Bike) L3.0 x 6 min (3' each fwd & back)   ? ?  ?  ? ?  ? ? ? ? ? ? ? ? ? ? ? ? PT Short Term Goals - 08/16/21 0941   ? ?  ? PT SHORT TERM GOAL #1  ? Title Ind with initial HEP   ? Status Achieved  08/12/21  ?  ? PT SHORT TERM GOAL #2  ? Title Patient to report decreased neck and right shoulder pain by 50% with ADLS   ? Status Achieved   08/12/21 - Pt reports 70% improvement in pain  ?  ? PT SHORT TERM GOAL #3  ? Title Patient to understand and demonstrate the importance of correct posture in decreasing and preventing further pain.   ? Status Achieved   08/16/21  ? ?  ?  ? ?  ? ? ? ? PT Long Term Goals - 10/14/21 1715   ? ?  ? PT LONG TERM GOAL #1  ? Title ind with advanced HEP   ? Status Achieved   10/14/21  ?  ? PT LONG TERM GOAL #2  ? Title Patient to demonstrate full functional neck ROM with 1/10 pain or less   ? Status Achieved   10/14/21 - full function neck ROM w/o pain reported  ?  ? PT LONG TERM GOAL #3  ? Title Patient to demo full functional right shoulder ROM to allow her to perform OH ADLS and don/doff clothing.   ? Status Achieved   08/19/21  ?  ? PT LONG TERM GOAL #4  ? Title Patient able to perform ADLS with >= 4+/5 R UE strength and pain at 1/10 or less   ? Status Achieved   10/14/21  ?  ? PT LONG TERM GOAL #5  ? Title Patient able to sleep without waking from pain   ? Status Achieved   10/14/21  ? ?  ?  ? ?  ? ? ? ? ? ? ? ? Plan - 10/14/21 1718   ? ? Clinical  Impression Statement Dawn Harrington reports her pain has been minimal to nonexistent recently with no further sleep disturbance or limitations with daily activities due to pain.  Her cervical and R shoulder AROM are now WNL w/o pain provocation, and R shoulder strength is 5/5. All goals now met and she feels confident with her HEP, denying need for further review, therefore will proceed with discharge from PT.   ? PT Treatment/Interventions ADLs/Self Care Home Management;Cryotherapy;Electrical Stimulation;Iontophoresis 48m/ml Dexamethasone;Moist Heat;Neuromuscular re-education;Therapeutic exercise;Therapeutic activities;Patient/family education;Manual techniques;Dry needling;Taping;Spinal Manipulations;Joint Manipulations   ? PT Next Visit Plan assess goals; review HEP if needed; prepare for D/C or hold from PT   ? PT Home Exercise Plan [name]  ? Consulted and Agree with Plan of Care Patient   ? ?  ?  ? ?  ? ? ?Patient will benefit from skilled therapeutic intervention in order to improve the following deficits and impairments:  Decreased range of motion, Pain, Impaired UE functional use, Increased muscle spasms, Impaired flexibility, Postural dysfunction, Decreased strength ? ?Visit Diagnosis: ?Acute pain of right shoulder ? ?Stiffness of right shoulder, not elsewhere classified ? ?Cervicalgia ? ?Other muscle spasm ? ?Muscle weakness (generalized) ? ? ? ? ?Problem List ?Patient Active Problem List  ? Diagnosis Date Noted  ? Rotator cuff strain, right, initial encounter 07/14/2021  ? Cervical strain 07/14/2021  ? Chest pain 04/04/2018  ? Hypertension 04/04/2018  ? GERD (gastroesophageal reflux disease) 04/04/2018  ? ? ? ? ?PHYSICAL THERAPY DISCHARGE SUMMARY ? ?Visits from Start of Care: 18 ? ?Current functional level related to goals / functional outcomes: ?  Refer to above clinical impression and goal assessment. ?  ?Remaining deficits: ?  None. ?  ?Education / Equipment: ?  HEP, Postural education ? ?Patient agrees to  discharge. Patient goals were  met. Patient is being discharged due to meeting the stated rehab goals. ? ?Dawn Harrington, PT ?10/14/2021, 5:42 PM ? ?[place] ?[facility] ?[place] ?[place], [place], [address] ?Phone: [phone]   Fax:  [phone] ? ?Name: Dawn Harrington ?MRN: [id number] ?Date of Birth: [birth year] ? ? ? ?

## 2022-10-19 ENCOUNTER — Encounter: Payer: Self-pay | Admitting: *Deleted
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
## 2023-10-10 DIAGNOSIS — G8929 Other chronic pain: Secondary | ICD-10-CM | POA: Diagnosis not present

## 2023-10-10 DIAGNOSIS — M5441 Lumbago with sciatica, right side: Secondary | ICD-10-CM | POA: Diagnosis not present

## 2023-10-18 DIAGNOSIS — R1013 Epigastric pain: Secondary | ICD-10-CM | POA: Diagnosis not present

## 2023-10-19 DIAGNOSIS — R079 Chest pain, unspecified: Secondary | ICD-10-CM | POA: Diagnosis not present

## 2023-10-26 DIAGNOSIS — Z1231 Encounter for screening mammogram for malignant neoplasm of breast: Secondary | ICD-10-CM | POA: Diagnosis not present

## 2023-10-27 DIAGNOSIS — M25561 Pain in right knee: Secondary | ICD-10-CM | POA: Diagnosis not present

## 2023-10-27 DIAGNOSIS — M1711 Unilateral primary osteoarthritis, right knee: Secondary | ICD-10-CM | POA: Diagnosis not present

## 2023-10-27 DIAGNOSIS — M47816 Spondylosis without myelopathy or radiculopathy, lumbar region: Secondary | ICD-10-CM | POA: Diagnosis not present

## 2023-10-27 DIAGNOSIS — G8929 Other chronic pain: Secondary | ICD-10-CM | POA: Diagnosis not present

## 2023-10-27 DIAGNOSIS — M25562 Pain in left knee: Secondary | ICD-10-CM | POA: Diagnosis not present

## 2023-10-27 DIAGNOSIS — I10 Essential (primary) hypertension: Secondary | ICD-10-CM | POA: Diagnosis not present

## 2023-10-27 DIAGNOSIS — K2100 Gastro-esophageal reflux disease with esophagitis, without bleeding: Secondary | ICD-10-CM | POA: Diagnosis not present

## 2023-10-27 DIAGNOSIS — M5442 Lumbago with sciatica, left side: Secondary | ICD-10-CM | POA: Diagnosis not present

## 2023-10-27 DIAGNOSIS — M1712 Unilateral primary osteoarthritis, left knee: Secondary | ICD-10-CM | POA: Diagnosis not present

## 2023-11-07 DIAGNOSIS — G8929 Other chronic pain: Secondary | ICD-10-CM | POA: Diagnosis not present

## 2023-11-07 DIAGNOSIS — M5441 Lumbago with sciatica, right side: Secondary | ICD-10-CM | POA: Diagnosis not present

## 2023-11-07 DIAGNOSIS — R29898 Other symptoms and signs involving the musculoskeletal system: Secondary | ICD-10-CM | POA: Diagnosis not present

## 2023-11-07 DIAGNOSIS — R262 Difficulty in walking, not elsewhere classified: Secondary | ICD-10-CM | POA: Diagnosis not present

## 2023-11-13 ENCOUNTER — Encounter (HOSPITAL_BASED_OUTPATIENT_CLINIC_OR_DEPARTMENT_OTHER): Payer: Self-pay

## 2023-11-13 ENCOUNTER — Other Ambulatory Visit: Payer: Self-pay

## 2023-11-13 ENCOUNTER — Emergency Department (HOSPITAL_BASED_OUTPATIENT_CLINIC_OR_DEPARTMENT_OTHER)

## 2023-11-13 ENCOUNTER — Emergency Department (HOSPITAL_BASED_OUTPATIENT_CLINIC_OR_DEPARTMENT_OTHER): Admission: EM | Admit: 2023-11-13 | Discharge: 2023-11-13 | Disposition: A | Discharge status: Home / Self Care

## 2023-11-13 DIAGNOSIS — Z79899 Other long term (current) drug therapy: Secondary | ICD-10-CM | POA: Insufficient documentation

## 2023-11-13 DIAGNOSIS — I10 Essential (primary) hypertension: Secondary | ICD-10-CM | POA: Insufficient documentation

## 2023-11-13 DIAGNOSIS — R1013 Epigastric pain: Secondary | ICD-10-CM | POA: Diagnosis not present

## 2023-11-13 DIAGNOSIS — N281 Cyst of kidney, acquired: Secondary | ICD-10-CM | POA: Diagnosis not present

## 2023-11-13 DIAGNOSIS — Z7982 Long term (current) use of aspirin: Secondary | ICD-10-CM | POA: Insufficient documentation

## 2023-11-13 DIAGNOSIS — R109 Unspecified abdominal pain: Secondary | ICD-10-CM | POA: Diagnosis present

## 2023-11-13 DIAGNOSIS — K429 Umbilical hernia without obstruction or gangrene: Secondary | ICD-10-CM | POA: Diagnosis not present

## 2023-11-13 DIAGNOSIS — R1011 Right upper quadrant pain: Secondary | ICD-10-CM | POA: Diagnosis not present

## 2023-11-13 DIAGNOSIS — K5901 Slow transit constipation: Secondary | ICD-10-CM | POA: Diagnosis not present

## 2023-11-13 DIAGNOSIS — K5710 Diverticulosis of small intestine without perforation or abscess without bleeding: Secondary | ICD-10-CM | POA: Diagnosis not present

## 2023-11-13 DIAGNOSIS — R0789 Other chest pain: Secondary | ICD-10-CM | POA: Diagnosis not present

## 2023-11-13 DIAGNOSIS — R1033 Periumbilical pain: Secondary | ICD-10-CM | POA: Insufficient documentation

## 2023-11-13 LAB — BASIC METABOLIC PANEL WITH GFR
Anion gap: 11 (ref 5–15)
BUN: 11 mg/dL (ref 8–23)
CO2: 26 mmol/L (ref 22–32)
Calcium: 9.3 mg/dL (ref 8.9–10.3)
Chloride: 100 mmol/L (ref 98–111)
Creatinine, Ser: 0.77 mg/dL (ref 0.44–1.00)
GFR, Estimated: >60 mL/min (ref >60)
Glucose, Bld: 96 mg/dL (ref 70–99)
Potassium: 3.7 mmol/L (ref 3.5–5.1)
Sodium: 137 mmol/L (ref 135–145)

## 2023-11-13 LAB — CBC
HCT: 40.8 % (ref 36.0–46.0)
Hemoglobin: 13.9 g/dL (ref 12.0–15.0)
MCH: 31.1 pg (ref 26.0–34.0)
MCHC: 34.1 g/dL (ref 30.0–36.0)
MCV: 91.3 fL (ref 80.0–100.0)
Platelets: 267 10*3/uL (ref 150–400)
RBC: 4.47 MIL/uL (ref 3.87–5.11)
RDW: 13.2 % (ref 11.5–15.5)
WBC: 7.9 10*3/uL (ref 4.0–10.5)
nRBC: 0 % (ref 0.0–0.2)

## 2023-11-13 LAB — HEPATIC FUNCTION PANEL
ALT: 13 U/L (ref 0–44)
AST: 17 U/L (ref 15–41)
Albumin: 4.3 g/dL (ref 3.5–5.0)
Alkaline Phosphatase: 54 U/L (ref 38–126)
Bilirubin, Direct: 0.1 mg/dL (ref 0.0–0.2)
Total Bilirubin: <0.2 mg/dL (ref 0.0–1.2)
Total Protein: 6.9 g/dL (ref 6.5–8.1)

## 2023-11-13 LAB — TROPONIN T, HIGH SENSITIVITY: Troponin T High Sensitivity: <15 ng/L (ref <19)

## 2023-11-13 LAB — LIPASE, BLOOD: Lipase: 39 U/L (ref 11–51)

## 2023-11-13 MED ORDER — IOHEXOL 300 MG/ML  SOLN
100.0000 mL | Freq: Once | INTRAMUSCULAR | Status: AC | PRN
  Administered 2023-11-13: 100 mL via INTRAVENOUS

## 2023-11-13 MED ORDER — MORPHINE SULFATE (PF) 4 MG/ML IV SOLN
4.0000 mg | Freq: Once | INTRAVENOUS | Status: AC
  Administered 2023-11-13: 4 mg via INTRAVENOUS
  Filled 2023-11-13: qty 1

## 2023-11-13 MED ORDER — ONDANSETRON HCL 4 MG/2ML IJ SOLN
4.0000 mg | Freq: Once | INTRAMUSCULAR | Status: AC
Start: 2023-11-13 — End: 2023-11-13
  Administered 2023-11-13: 4 mg via INTRAVENOUS
  Filled 2023-11-13: qty 2

## 2023-11-13 MED ORDER — SUCRALFATE 1 G PO TABS: 1.0000 g | ORAL_TABLET | Freq: Three times a day (TID) | ORAL | 0 refills | Status: AC

## 2023-11-13 MED ORDER — SUCRALFATE 1 G PO TABS: 1.0000 g | ORAL_TABLET | Freq: Three times a day (TID) | ORAL | 0 refills | Status: DC

## 2023-11-13 MED ORDER — ALUM & MAG HYDROXIDE-SIMETH 200-200-20 MG/5ML PO SUSP
30.0000 mL | Freq: Once | ORAL | Status: AC
  Administered 2023-11-13: 30 mL via ORAL
  Filled 2023-11-13: qty 30

## 2023-11-13 NOTE — ED Triage Notes (Signed)
 Pt reports that she is coming to ED with chest pain. States that she started having chest pain this morning. Denies nausea and vomiting. Pt states that she thinks her BP may be up and she is having some dizziness. Pt is having some SOB at times.

## 2023-11-13 NOTE — ED Provider Notes (Signed)
 [facility] Provider Note   CSN: [id number] Arrival date & time: 11/13/23  1824     History  Chief Complaint  Patient presents with   Chest Pain    Dawn Harrington is a 63 y.o. female.  63 year old female with past medical history of hypertension presenting to the emergency department today with chest and right-sided abdominal pain.  Patient states this has been going on since this morning.  Reports that has been steady since then.  She has had some nausea but denies any vomiting.  Reports normal bowel movements.  She denies any pleuritic pain.  Denies any leg swelling.  Denies any fevers, chills, or cough.  Denies any hemoptysis.  She came to the ER today for further evaluation regarding this due to ongoing symptoms.  She states that it is a burning pain.   Chest Pain Associated symptoms: abdominal pain        Home Medications Prior to Admission medications   Medication Sig Start Date End Date Taking? Authorizing Provider  sucralfate (CARAFATE) 1 g tablet Take 1 tablet (1 g total) by mouth 4 (four) times daily -  with meals and at bedtime. 11/13/23  Yes Carin Charleston, MD  aspirin  81 MG tablet Take 81 mg by mouth daily as needed for pain.  Patient not taking: Reported on 07/15/2021    [provider]  Cholecalciferol  2000 UNITS CAPS Take 2,000 Units by mouth daily.  Patient not taking: Reported on 07/15/2021    [provider]  lisinopril (PRINIVIL,ZESTRIL) 5 MG tablet Take 5 mg by mouth daily. 03/20/18   [provider]  meloxicam  (MOBIC ) 7.5 MG tablet Take 1 tablet (7.5 mg total) by mouth 2 (two) times daily as needed. Patient not taking: Reported on 07/15/2021 07/14/21   Margaree Shark, MD  omeprazole (PRILOSEC) 20 MG capsule Take 20 mg by mouth 2 (two) times daily.  02/12/18 02/12/19  [provider]      Allergies    Patient has no known allergies.    Review of Systems   Review of Systems   Cardiovascular:  Positive for chest pain.  Gastrointestinal:  Positive for abdominal pain.  All other systems reviewed and are negative.   Physical Exam Updated Vital Signs BP (!) 149/76   Pulse 60   Temp 97.7 F (36.5 C)   Resp 20   Ht 5\' 9"  (1.753 m)   Wt 68 kg   SpO2 100%   BMI 22.15 kg/m  Physical Exam Vitals and nursing note reviewed.   Gen: NAD Eyes: PERRL, EOMI HEENT: no oropharyngeal swelling Neck: trachea midline Resp: clear to auscultation bilaterally Card: RRR, no murmurs, rubs, or gallops Abd: The patient is tender over the epigastrium and right upper quadrant with guarding to deep palpation of the right upper quadrant, there is also some mild periumbilical tenderness noted, no lower abdominal tenderness noted Extremities: no calf tenderness, no edema Vascular: 2+ radial pulses bilaterally, 2+ DP pulses bilaterally Skin: no rashes Psyc: acting appropriately   ED Results / Procedures / Treatments   Labs (all labs ordered are listed, but only abnormal results are displayed) Labs Reviewed  BASIC METABOLIC PANEL WITH GFR  CBC  HEPATIC FUNCTION PANEL  LIPASE, BLOOD  TROPONIN T, HIGH SENSITIVITY    EKG None  Radiology CT ABDOMEN PELVIS W CONTRAST Result Date: 11/13/2023 CLINICAL DATA:  Acute abdominal pain. EXAM: CT ABDOMEN AND PELVIS WITH CONTRAST TECHNIQUE: Multidetector CT imaging of the  abdomen and pelvis was performed using the standard protocol following bolus administration of intravenous contrast. RADIATION DOSE REDUCTION: This exam was performed according to the departmental dose-optimization program which includes automated exposure control, adjustment of the mA and/or kV according to patient size and/or use of iterative reconstruction technique. CONTRAST:  100mL OMNIPAQUE IOHEXOL 300 MG/ML  SOLN COMPARISON:  Remote CT 06/15/2010 FINDINGS: Lower chest: Minimal hypoventilatory changes dependently. No pleural fluid or confluent consolidation.  Hepatobiliary: Scattered tiny hepatic hypodensities are too small to accurately characterize. Gallbladder is decompressed. No calcified gallstone. No biliary dilatation. Pancreas: No ductal dilatation or inflammation. Spleen: Normal in size without focal abnormality. Adrenals/Urinary Tract: Normal adrenal glands. No hydronephrosis or renal calculi. No perinephric inflammation. Small left renal cyst. No further follow-up imaging is recommended. No suspicious renal lesion. Unremarkable urinary bladder. Stomach/Bowel: The stomach is nondistended. There is a periampullary duodenal diverticulum. No small bowel obstruction or inflammation. Fecalization of distal small bowel contents in the pelvis. The appendix is not seen, appendectomy per history. Moderate volume of stool in the colon. No colonic inflammation. Vascular/Lymphatic: Mild aortic atherosclerosis. No aneurysm. The portal vein is patent. No bulky abdominopelvic adenopathy. Reproductive: Uterus and bilateral adnexa are unremarkable. Other: No ascites or free air. Diminutive fat containing umbilical hernia. Musculoskeletal: There are no acute or suspicious osseous abnormalities. IMPRESSION: 1. No acute abnormality in the abdomen/pelvis. 2. Moderate colonic stool burden with Fecalization of distal small bowel contents in the pelvis, can be seen with slow transit/constipation. Aortic Atherosclerosis (ICD10-I70.0). Electronically Signed   By: Chadwick Colonel M.D.   On: 11/13/2023 20:42   DG Chest 2 View Result Date: 11/13/2023 CLINICAL DATA:  Central chest pain. EXAM: CHEST - 2 VIEW COMPARISON:  October 24, 2022 FINDINGS: The heart size and mediastinal contours are within normal limits. Both lungs are clear. The visualized skeletal structures are unremarkable. IMPRESSION: No active cardiopulmonary disease. Electronically Signed   By: Virgle Grime M.D.   On: 11/13/2023 19:35    Procedures Procedures    Medications Ordered in ED Medications  morphine   (PF) 4 MG/ML injection 4 mg (4 mg Intravenous Given 11/13/23 1942)  ondansetron  (ZOFRAN ) injection 4 mg (4 mg Intravenous Given 11/13/23 1940)  alum & mag hydroxide-simeth (MAALOX/MYLANTA) 200-200-20 MG/5ML suspension 30 mL (30 mLs Oral Given 11/13/23 1943)  iohexol (OMNIPAQUE) 300 MG/ML solution 100 mL (100 mLs Intravenous Contrast Given 11/13/23 2019)    ED Course/ Medical Decision Making/ A&P                                 Medical Decision Making 63 year old female with past medical history of hypertension presenting to the emergency department today with chest and abdominal pain.  She has been more located around the right upper quadrant and epigastrium.  Will further evaluate her here with basic lab as well as an EKG, chest x-ray, and troponin for further evaluation for ACS, pulmonary edema, pulmonary infiltrates, or pneumothorax.  Will also obtain LFTs and a lipase to evaluate for hepatobiliary otology or pancreatitis.  Also obtain a CT scan of her abdomen to evaluate for pancreatitis, diverticulitis, colitis, cholecystitis, or other intra-abdominal pathology.  Will give the patient morphine  Zofran  for symptoms as well as a GI cocktail in the event that this is due to GERD.  Will reevaluate for ultimate disposition.  The patient's labs here are reassuring.  CT scan does not show any concerning findings.  She reports significant improvement  in her symptoms with the medications here specifically the GI cocktail.  I will add Carafate to her regimen.  She is given GI follow-up with return precautions.  Amount and/or Complexity of Data Reviewed Labs: ordered. Radiology: ordered.  Risk OTC drugs. Prescription drug management.           Final Clinical Impression(s) / ED Diagnoses Final diagnoses:  Epigastric pain    Rx / DC Orders ED Discharge Orders          Ordered    sucralfate (CARAFATE) 1 g tablet  3 times daily with meals & bedtime        11/13/23 2231               Carin Charleston, MD 11/13/23 2232

## 2023-11-13 NOTE — Discharge Instructions (Signed)
 Your workup today was reassuring.  Please continue the omeprazole and start the Carafate.  Please call to schedule a follow-up appointment with the GI doctor at the number provided.  Return to the ER for worsening symptoms.

## 2023-11-17 DIAGNOSIS — Z0000 Encounter for general adult medical examination without abnormal findings: Secondary | ICD-10-CM | POA: Diagnosis not present

## 2023-11-17 DIAGNOSIS — Z1211 Encounter for screening for malignant neoplasm of colon: Secondary | ICD-10-CM | POA: Diagnosis not present

## 2023-11-20 DIAGNOSIS — M5441 Lumbago with sciatica, right side: Secondary | ICD-10-CM | POA: Diagnosis not present

## 2023-11-20 DIAGNOSIS — R262 Difficulty in walking, not elsewhere classified: Secondary | ICD-10-CM | POA: Diagnosis not present

## 2023-11-20 DIAGNOSIS — R29898 Other symptoms and signs involving the musculoskeletal system: Secondary | ICD-10-CM | POA: Diagnosis not present

## 2023-11-20 DIAGNOSIS — G8929 Other chronic pain: Secondary | ICD-10-CM | POA: Diagnosis not present

## 2023-12-06 DIAGNOSIS — R29898 Other symptoms and signs involving the musculoskeletal system: Secondary | ICD-10-CM | POA: Diagnosis not present

## 2023-12-06 DIAGNOSIS — G8929 Other chronic pain: Secondary | ICD-10-CM | POA: Diagnosis not present

## 2023-12-06 DIAGNOSIS — M5441 Lumbago with sciatica, right side: Secondary | ICD-10-CM | POA: Diagnosis not present

## 2023-12-06 DIAGNOSIS — R262 Difficulty in walking, not elsewhere classified: Secondary | ICD-10-CM | POA: Diagnosis not present

## 2023-12-11 ENCOUNTER — Other Ambulatory Visit: Payer: Self-pay

## 2023-12-11 ENCOUNTER — Encounter (HOSPITAL_BASED_OUTPATIENT_CLINIC_OR_DEPARTMENT_OTHER): Payer: Self-pay | Admitting: Emergency Medicine

## 2023-12-11 ENCOUNTER — Emergency Department (HOSPITAL_BASED_OUTPATIENT_CLINIC_OR_DEPARTMENT_OTHER): Admission: EM | Admit: 2023-12-11 | Discharge: 2023-12-11 | Disposition: A | Discharge status: Home / Self Care

## 2023-12-11 DIAGNOSIS — Z79899 Other long term (current) drug therapy: Secondary | ICD-10-CM | POA: Diagnosis not present

## 2023-12-11 DIAGNOSIS — G629 Polyneuropathy, unspecified: Secondary | ICD-10-CM | POA: Diagnosis not present

## 2023-12-11 DIAGNOSIS — Z7982 Long term (current) use of aspirin: Secondary | ICD-10-CM | POA: Diagnosis not present

## 2023-12-11 DIAGNOSIS — I10 Essential (primary) hypertension: Secondary | ICD-10-CM | POA: Diagnosis not present

## 2023-12-11 LAB — CBC WITH DIFFERENTIAL/PLATELET
Abs Immature Granulocytes: 0.02 10*3/uL (ref 0.00–0.07)
Basophils Absolute: 0.1 10*3/uL (ref 0.0–0.1)
Basophils Relative: 1 %
Eosinophils Absolute: 0.1 10*3/uL (ref 0.0–0.5)
Eosinophils Relative: 1 %
HCT: 37 % (ref 36.0–46.0)
Hemoglobin: 12.9 g/dL (ref 12.0–15.0)
Immature Granulocytes: 0 %
Lymphocytes Relative: 32 %
Lymphs Abs: 2 10*3/uL (ref 0.7–4.0)
MCH: 31.8 pg (ref 26.0–34.0)
MCHC: 34.9 g/dL (ref 30.0–36.0)
MCV: 91.1 fL (ref 80.0–100.0)
Monocytes Absolute: 0.6 10*3/uL (ref 0.1–1.0)
Monocytes Relative: 10 %
Neutro Abs: 3.5 10*3/uL (ref 1.7–7.7)
Neutrophils Relative %: 56 %
Platelets: 255 10*3/uL (ref 150–400)
RBC: 4.06 MIL/uL (ref 3.87–5.11)
RDW: 13.3 % (ref 11.5–15.5)
WBC: 6.2 10*3/uL (ref 4.0–10.5)
nRBC: 0 % (ref 0.0–0.2)

## 2023-12-11 LAB — COMPREHENSIVE METABOLIC PANEL WITH GFR
ALT: 10 U/L (ref 0–44)
AST: 17 U/L (ref 15–41)
Albumin: 4.1 g/dL (ref 3.5–5.0)
Alkaline Phosphatase: 51 U/L (ref 38–126)
Anion gap: 10 (ref 5–15)
BUN: 8 mg/dL (ref 8–23)
CO2: 26 mmol/L (ref 22–32)
Calcium: 9.3 mg/dL (ref 8.9–10.3)
Chloride: 106 mmol/L (ref 98–111)
Creatinine, Ser: 0.7 mg/dL (ref 0.44–1.00)
GFR, Estimated: >60 mL/min (ref >60)
Glucose, Bld: 89 mg/dL (ref 70–99)
Potassium: 3.8 mmol/L (ref 3.5–5.1)
Sodium: 142 mmol/L (ref 135–145)
Total Bilirubin: 0.3 mg/dL (ref 0.0–1.2)
Total Protein: 6.4 g/dL — ABNORMAL LOW (ref 6.5–8.1)

## 2023-12-11 MED ORDER — PREGABALIN 25 MG PO CAPS: 25.0000 mg | ORAL_CAPSULE | Freq: Two times a day (BID) | ORAL | 0 refills | Status: DC

## 2023-12-11 MED ORDER — PREGABALIN 25 MG PO CAPS: 25.0000 mg | ORAL_CAPSULE | Freq: Two times a day (BID) | ORAL | 0 refills | Status: AC

## 2023-12-11 NOTE — ED Provider Notes (Signed)
 [facility] Provider Note   Dawn Harrington: [id number] Arrival date & time: 12/11/23  1131     History  Chief Complaint  Patient presents with   Generalized Body Aches    Dawn Harrington is a 63 y.o. female presents today with concerns for bilateral arm and bilateral leg pain.  She describes pain as shooting type, electrical sensation type pain that worsens with ambulation.  Primarily concerned with pain in bilateral legs.  She states that pain starts in the bilateral calves and radiates up into the bilateral hips.  Has had intermittent body aches over the last year, with a previous history of osteoarthritis.  HPI Noted previous diagnoses of hypertension, GERD, dysmenorrhea.    Home Medications Prior to Admission medications   Medication Sig Start Date End Date Taking? Authorizing Provider  aspirin  81 MG tablet Take 81 mg by mouth daily as needed for pain.  Patient not taking: Reported on 07/15/2021    [provider]  Cholecalciferol  2000 UNITS CAPS Take 2,000 Units by mouth daily.  Patient not taking: Reported on 07/15/2021    [provider]  lisinopril (PRINIVIL,ZESTRIL) 5 MG tablet Take 5 mg by mouth daily. 03/20/18   [provider]  meloxicam  (MOBIC ) 7.5 MG tablet Take 1 tablet (7.5 mg total) by mouth 2 (two) times daily as needed. Patient not taking: Reported on 07/15/2021 07/14/21   Margaree Shark, MD  omeprazole (PRILOSEC) 20 MG capsule Take 20 mg by mouth 2 (two) times daily.  02/12/18 02/12/19  [provider]  sucralfate  (CARAFATE ) 1 g tablet Take 1 tablet (1 g total) by mouth 4 (four) times daily -  with meals and at bedtime. 11/13/23   Carin Charleston, MD      Allergies    Lisinopril    Review of Systems   Review of Systems  Musculoskeletal:  Positive for arthralgias and joint swelling.  All other systems reviewed and are negative.   Physical Exam Updated Vital Signs BP 135/69 (BP Location: Left  Arm)   Pulse 76   Temp 98.5 F (36.9 C) (Oral)   Resp 16   Wt 68 kg   SpO2 100%   BMI 22.14 kg/m  Physical Exam Vitals and nursing note reviewed.  Constitutional:      General: She is not in acute distress.    Appearance: Normal appearance.  HENT:     Head: Normocephalic and atraumatic.     Mouth/Throat:     Mouth: Mucous membranes are moist.     Pharynx: Oropharynx is clear.  Eyes:     Extraocular Movements: Extraocular movements intact.     Conjunctiva/sclera: Conjunctivae normal.     Pupils: Pupils are equal, round, and reactive to light.  Cardiovascular:     Rate and Rhythm: Normal rate and regular rhythm.     Pulses: Normal pulses.          Radial pulses are 2+ on the right side and 2+ on the left side.       Dorsalis pedis pulses are 2+ on the right side and 2+ on the left side.       Posterior tibial pulses are 2+ on the right side and 2+ on the left side.     Heart sounds: Normal heart sounds. No murmur heard.    No friction rub. No gallop.  Pulmonary:     Effort: Pulmonary effort is normal.     Breath sounds: Normal breath sounds.  Abdominal:     General: Abdomen is flat. Bowel sounds are normal.     Palpations: Abdomen is soft.  Musculoskeletal:        General: Normal range of motion.     Cervical back: Normal range of motion and neck supple.     Right lower leg: Tenderness present. No edema.     Left lower leg: Tenderness present. No edema.     Right foot: Bunion present.     Left foot: Bunion present.     Comments: Tenderness with palpation of bilateral calves.  Skin:    General: Skin is warm and dry.     Capillary Refill: Capillary refill takes less than 2 seconds.  Neurological:     General: No focal deficit present.     Mental Status: She is alert. Mental status is at baseline.  Psychiatric:        Mood and Affect: Mood normal.     ED Results / Procedures / Treatments   Labs (all labs ordered are listed, but only abnormal results are  displayed) Labs Reviewed - No data to display  EKG None  Radiology No results found.  Procedures Procedures    Medications Ordered in ED Medications - No data to display  ED Course/ Medical Decision Making/ A&P                                 Medical Decision Making Amount and/or Complexity of Data Reviewed Labs: ordered.   Medical Decision Making:   [name] is a 63 y.o. female who presented to the ED today with primary concern of bilateral lower extremity discomfort along with bilateral upper extremity pain.  Detailed above.     Complete initial physical exam performed, notably the patient  was alert and oriented no apparent distress.  There is tenderness to palpation of bilateral calves, there is +2 pulses noted at the dorsalis pedis and posterior tibialis bilaterally.  There is no notable lower extremity edema.     Reviewed and confirmed nursing documentation for past medical history, family history, social history.    Initial Assessment:   With the patient's presentation of concerns for bilateral lower extremity and upper extremity pain, most likely diagnosis is peripheral neuropathy. Other diagnoses were considered including (but not limited to) occlusive vascular disease. These are considered less likely due to history of present illness and physical exam findings.     Initial Plan:   Screening labs including CBC and Metabolic panel to evaluate for infectious or metabolic etiology of disease.  Objective evaluation as below reviewed   Initial Study Results:   Laboratory  All laboratory results reviewed without evidence of clinically relevant pathology.   Exceptions include: None   Reassessment and Plan:   Based on clinical exam of this patient along with lab findings that were completely benign, believe this patient's symptoms to be consistent with peripheral neuropathy.  At this time we will manage this with an outpatient course of pregabalin and referral to  her primary care for continuing management.  CT scans obtained on the 12th May did not demonstrate any acute bony abnormalities of the lumbar or thoracic spine.            Final Clinical Impression(s) / ED Diagnoses Final diagnoses:  None    Rx / DC Orders ED Discharge Orders     None         Hercules Lombard,  Synthia Ewing, PA 12/11/23 1448    Rolinda Climes, DO 12/14/23 [phone]

## 2023-12-11 NOTE — ED Triage Notes (Signed)
 Intermittent body ache x 1 year , Hx arthritis . Today's pain is more in bilateral arms and bilateral legs .

## 2023-12-11 NOTE — Discharge Instructions (Signed)
 As we discussed, please follow-up with your primary care provider regarding this new onset of pain in the lower extremities.  A prescription for medication for your leg pain has been given to you, please continue to use this until directed otherwise by your primary care provider.

## 2023-12-12 DIAGNOSIS — R262 Difficulty in walking, not elsewhere classified: Secondary | ICD-10-CM | POA: Diagnosis not present

## 2023-12-12 DIAGNOSIS — M5441 Lumbago with sciatica, right side: Secondary | ICD-10-CM | POA: Diagnosis not present

## 2023-12-12 DIAGNOSIS — G8929 Other chronic pain: Secondary | ICD-10-CM | POA: Diagnosis not present

## 2023-12-12 DIAGNOSIS — R29898 Other symptoms and signs involving the musculoskeletal system: Secondary | ICD-10-CM | POA: Diagnosis not present

## 2023-12-20 DIAGNOSIS — G8929 Other chronic pain: Secondary | ICD-10-CM | POA: Diagnosis not present

## 2023-12-20 DIAGNOSIS — R262 Difficulty in walking, not elsewhere classified: Secondary | ICD-10-CM | POA: Diagnosis not present

## 2023-12-20 DIAGNOSIS — R29898 Other symptoms and signs involving the musculoskeletal system: Secondary | ICD-10-CM | POA: Diagnosis not present

## 2023-12-20 DIAGNOSIS — M5441 Lumbago with sciatica, right side: Secondary | ICD-10-CM | POA: Diagnosis not present

## 2024-01-01 DIAGNOSIS — M19071 Primary osteoarthritis, right ankle and foot: Secondary | ICD-10-CM | POA: Diagnosis not present

## 2024-01-01 DIAGNOSIS — M79671 Pain in right foot: Secondary | ICD-10-CM | POA: Diagnosis not present

## 2024-01-15 DIAGNOSIS — Z7184 Encounter for health counseling related to travel: Secondary | ICD-10-CM | POA: Diagnosis not present

## 2024-01-15 DIAGNOSIS — Z2989 Encounter for other specified prophylactic measures: Secondary | ICD-10-CM | POA: Diagnosis not present

## 2024-01-22 DIAGNOSIS — M7731 Calcaneal spur, right foot: Secondary | ICD-10-CM | POA: Diagnosis not present

## 2024-01-22 DIAGNOSIS — M76821 Posterior tibial tendinitis, right leg: Secondary | ICD-10-CM | POA: Diagnosis not present

## 2024-06-07 DIAGNOSIS — Z0100 Encounter for examination of eyes and vision without abnormal findings: Secondary | ICD-10-CM | POA: Diagnosis not present

## 2024-06-07 DIAGNOSIS — M545 Low back pain, unspecified: Secondary | ICD-10-CM | POA: Diagnosis not present

## 2024-06-07 DIAGNOSIS — N3001 Acute cystitis with hematuria: Secondary | ICD-10-CM | POA: Diagnosis not present

## 2024-06-07 DIAGNOSIS — F4321 Adjustment disorder with depressed mood: Secondary | ICD-10-CM | POA: Diagnosis not present

## 2024-08-05 ENCOUNTER — Emergency Department (HOSPITAL_BASED_OUTPATIENT_CLINIC_OR_DEPARTMENT_OTHER)

## 2024-08-05 ENCOUNTER — Emergency Department (HOSPITAL_BASED_OUTPATIENT_CLINIC_OR_DEPARTMENT_OTHER)
Admission: EM | Admit: 2024-08-05 | Discharge: 2024-08-05 | Disposition: A | Discharge status: Home / Self Care | Attending: Emergency Medicine | Admitting: Emergency Medicine

## 2024-08-05 ENCOUNTER — Encounter (HOSPITAL_BASED_OUTPATIENT_CLINIC_OR_DEPARTMENT_OTHER): Payer: Self-pay

## 2024-08-05 ENCOUNTER — Other Ambulatory Visit: Payer: Self-pay

## 2024-08-05 DIAGNOSIS — I10 Essential (primary) hypertension: Secondary | ICD-10-CM | POA: Insufficient documentation

## 2024-08-05 DIAGNOSIS — R079 Chest pain, unspecified: Secondary | ICD-10-CM

## 2024-08-05 DIAGNOSIS — R1024 Suprapubic pain: Secondary | ICD-10-CM | POA: Insufficient documentation

## 2024-08-05 DIAGNOSIS — Z7982 Long term (current) use of aspirin: Secondary | ICD-10-CM | POA: Insufficient documentation

## 2024-08-05 DIAGNOSIS — Z79899 Other long term (current) drug therapy: Secondary | ICD-10-CM | POA: Insufficient documentation

## 2024-08-05 DIAGNOSIS — R519 Headache, unspecified: Secondary | ICD-10-CM | POA: Insufficient documentation

## 2024-08-05 DIAGNOSIS — R0602 Shortness of breath: Secondary | ICD-10-CM | POA: Insufficient documentation

## 2024-08-05 DIAGNOSIS — R0789 Other chest pain: Secondary | ICD-10-CM | POA: Insufficient documentation

## 2024-08-05 LAB — CBC
HCT: 39.6 % (ref 36.0–46.0)
Hemoglobin: 13.7 g/dL (ref 12.0–15.0)
MCH: 30.4 pg (ref 26.0–34.0)
MCHC: 34.6 g/dL (ref 30.0–36.0)
MCV: 88 fL (ref 80.0–100.0)
Platelets: 289 10*3/uL (ref 150–400)
RBC: 4.5 MIL/uL (ref 3.87–5.11)
RDW: 13.7 % (ref 11.5–15.5)
WBC: 7.4 10*3/uL (ref 4.0–10.5)
nRBC: 0 % (ref 0.0–0.2)

## 2024-08-05 LAB — URINALYSIS, MICROSCOPIC (REFLEX)

## 2024-08-05 LAB — BASIC METABOLIC PANEL WITH GFR
Anion gap: 11 (ref 5–15)
BUN: 11 mg/dL (ref 8–23)
CO2: 26 mmol/L (ref 22–32)
Calcium: 9.5 mg/dL (ref 8.9–10.3)
Chloride: 103 mmol/L (ref 98–111)
Creatinine, Ser: 0.74 mg/dL (ref 0.44–1.00)
GFR, Estimated: >60 mL/min
Glucose, Bld: 96 mg/dL (ref 70–99)
Potassium: 3.5 mmol/L (ref 3.5–5.1)
Sodium: 140 mmol/L (ref 135–145)

## 2024-08-05 LAB — TROPONIN T, HIGH SENSITIVITY
Troponin T High Sensitivity: <6 ng/L (ref 0–19)
Troponin T High Sensitivity: 6 ng/L (ref 0–19)

## 2024-08-05 LAB — URINALYSIS, ROUTINE W REFLEX MICROSCOPIC
Bilirubin Urine: NEGATIVE
Glucose, UA: NEGATIVE mg/dL
Ketones, ur: NEGATIVE mg/dL
Leukocytes,Ua: NEGATIVE
Nitrite: NEGATIVE
Protein, ur: NEGATIVE mg/dL
Specific Gravity, Urine: 1.015 (ref 1.005–1.030)
pH: 7.5 (ref 5.0–8.0)

## 2024-08-05 LAB — D-DIMER, QUANTITATIVE: D-Dimer, Quant: 0.43 ug{FEU}/mL (ref 0.00–0.50)

## 2024-08-05 LAB — LIPASE, BLOOD: Lipase: 32 U/L (ref 11–51)

## 2024-08-05 MED ORDER — DEXAMETHASONE SOD PHOSPHATE PF 10 MG/ML IJ SOLN
10.0000 mg | Freq: Once | INTRAMUSCULAR | Status: AC
  Administered 2024-08-05: 10 mg via INTRAVENOUS
  Filled 2024-08-05: qty 1

## 2024-08-05 MED ORDER — PROCHLORPERAZINE EDISYLATE 10 MG/2ML IJ SOLN
10.0000 mg | Freq: Once | INTRAMUSCULAR | Status: AC
  Administered 2024-08-05: 10 mg via INTRAVENOUS
  Filled 2024-08-05: qty 2

## 2024-08-05 MED ORDER — KETOROLAC TROMETHAMINE 15 MG/ML IJ SOLN
15.0000 mg | Freq: Once | INTRAMUSCULAR | Status: AC
  Administered 2024-08-05: 15 mg via INTRAVENOUS
  Filled 2024-08-05: qty 1

## 2024-08-05 MED ORDER — DIPHENHYDRAMINE HCL 50 MG/ML IJ SOLN
25.0000 mg | Freq: Once | INTRAMUSCULAR | Status: AC
  Administered 2024-08-05: 25 mg via INTRAVENOUS
  Filled 2024-08-05: qty 1

## 2024-08-05 MED ORDER — IOHEXOL 350 MG/ML SOLN
75.0000 mL | Freq: Once | INTRAVENOUS | Status: AC | PRN
  Administered 2024-08-05: 75 mL via INTRAVENOUS

## 2024-08-05 NOTE — Discharge Instructions (Addendum)
 Your lab work today was reassuring, your CT scan does not show any acute abnormality but you do have some mild atherosclerosis, otherwise known as hardening of the arteries, in the vessels of your brain.  This is something that can occur over time and is typically associated with conditions like high cholesterol, please discuss this with your primary care provider at your next visit. If your symptoms return or worsen, please return to the emergency department.  ???? ????? ??????? ???????? ????? ??????? ??? ????? ??????? ??????? ??????? ?? ??? ???? ???? ???? ??? ???? ???????? ?????? ?? ????? ?????. ??? ???? ?? ???? ?? ???? ?????? ?????? ?? ???? ?????? ?????? ??? ?????? ???????????? ??? ????? ?????? ??? ????? ?? ????? ??????? ?? ?????? ???????. ??? ???? ??????? ?? ??????? ????? ?????? ??? ??? ???????.

## 2024-08-05 NOTE — ED Notes (Signed)
 D/c paperwork reviewed with pt, including follow up care.  No questions or concerns voiced at time of d/c. Marland Kitchen Pt verbalized understanding, Ambulatory with family to ED exit, NAD.

## 2024-08-05 NOTE — ED Notes (Signed)
 Patient transported to CT

## 2024-08-05 NOTE — ED Notes (Signed)
Lab notified to add-on urine culture to previously collected urine sample.  

## 2024-08-05 NOTE — ED Provider Notes (Signed)
 " [facility] Provider Note   CSN: [id number] Arrival date & time: 08/05/24  1700     Patient presents with: Chest Pain and Headache   Dawn Harrington is a 64 y.o. female.   64 year old female presenting with multiple complaints.  Patient notes a sudden onset of a headache approximately 2 hours that has been worsening in intensity, states that she used to get headaches a long time ago, but this is atypical for her.  Endorses epigastric/substernal chest pain as well, describes headache and chest pain as a throbbing sensation, this is associated with shortness of breath at times.  Denies vision changes but does report occasional room spinning sensation, none currently.  History of a heart attack many years ago, no known history of stroke/TIA.  Reports some lower abdominal discomfort at times, denies dysuria but does report occasional pain which she attributes to having a bowel movement.   Chest Pain Associated symptoms: headache   Headache      Prior to Admission medications  Medication Sig Start Date End Date Taking? Authorizing Provider  aspirin  81 MG tablet Take 81 mg by mouth daily as needed for pain.  Patient not taking: Reported on 07/15/2021    [provider]  Cholecalciferol  2000 UNITS CAPS Take 2,000 Units by mouth daily.  Patient not taking: Reported on 07/15/2021    [provider]  lisinopril (PRINIVIL,ZESTRIL) 5 MG tablet Take 5 mg by mouth daily. 03/20/18   [provider]  meloxicam  (MOBIC ) 7.5 MG tablet Take 1 tablet (7.5 mg total) by mouth 2 (two) times daily as needed. Patient not taking: Reported on 07/15/2021 07/14/21   Chick Venetia BRAVO, MD  omeprazole (PRILOSEC) 20 MG capsule Take 20 mg by mouth 2 (two) times daily.  02/12/18 02/12/19  [provider]  pregabalin  (LYRICA ) 25 MG capsule Take 1 capsule (25 mg total) by mouth 2 (two) times daily. 12/11/23   Myriam Dorn BROCKS, PA   sucralfate  (CARAFATE ) 1 g tablet Take 1 tablet (1 g total) by mouth 4 (four) times daily -  with meals and at bedtime. 11/13/23   Ula Prentice SAUNDERS, MD    Allergies: Lisinopril    Review of Systems  Cardiovascular:  Positive for chest pain.  Neurological:  Positive for headaches.    Updated Vital Signs  Vitals:   08/05/24 1706 08/05/24 1926 08/05/24 1927 08/05/24 1945  BP: (!) 169/85 127/75  132/73  Pulse: 72  65 78  Resp: 18   17  Temp: 98.3 F (36.8 C)     SpO2: 98%  100% 100%     Physical Exam Vitals and nursing note reviewed.  Constitutional:      General: She is not in acute distress.    Appearance: Normal appearance. She is not ill-appearing or toxic-appearing.  HENT:     Head: Normocephalic and atraumatic.     Right Ear: Tympanic membrane normal.     Left Ear: Tympanic membrane normal.  Eyes:     Extraocular Movements: Extraocular movements intact.     Pupils: Pupils are equal, round, and reactive to light.     Comments: No nystagmus  Cardiovascular:     Rate and Rhythm: Normal rate and regular rhythm.     Heart sounds: Murmur heard.  Pulmonary:     Effort: Pulmonary effort is normal.     Breath sounds: Normal breath sounds.  Abdominal:     Palpations: Abdomen is soft.  Tenderness: There is abdominal tenderness (mild, suprapubic region). There is no guarding.  Musculoskeletal:     Cervical back: Normal range of motion and neck supple. No rigidity or tenderness.     Comments: 5/5 strength against resistance of bilateral upper and lower extremities Grip strength symmetric and intact  Skin:    General: Skin is warm and dry.  Neurological:     General: No focal deficit present.     Mental Status: She is alert and oriented to person, place, and time.     Comments: Facial expressions are symmetric and intact without evidence of facial droop Normal cerebellar testing, including finger-nose, without ataxia Negative pronator drift No appreciable sensory  deficit/weakness     (all labs ordered are listed, but only abnormal results are displayed) Labs Reviewed  URINALYSIS, ROUTINE W REFLEX MICROSCOPIC - Abnormal; Notable for the following components:      Result Value   Hgb urine dipstick TRACE (*)    All other components within normal limits  URINALYSIS, MICROSCOPIC (REFLEX) - Abnormal; Notable for the following components:   Bacteria, UA RARE (*)    All other components within normal limits  URINE CULTURE  BASIC METABOLIC PANEL WITH GFR  CBC  D-DIMER, QUANTITATIVE  LIPASE, BLOOD  TROPONIN T, HIGH SENSITIVITY  TROPONIN T, HIGH SENSITIVITY    EKG: EKG Interpretation Date/Time:  Monday August 05 2024 17:09:38 EST Ventricular Rate:  70 PR Interval:  131 QRS Duration:  86 QT Interval:  397 QTC Calculation: 429 R Axis:   -17  Text Interpretation: Sinus rhythm Borderline left axis deviation Consider anterior infarct when compared to prior, similar appearance No STEMI Confirmed by Ginger Barefoot ([id number]) on 08/05/2024 5:15:51 PM  Radiology: CT Angio Head W or Wo Contrast Result Date: 08/05/2024 EXAM: CT HEAD WITHOUT CTA HEAD WITH AND WITHOUT 08/05/2024 06:37:01 PM TECHNIQUE: CT of the head was performed without the administration of intravenous contrast followed by a CTA of the head was performed with and without the administration of intravenous contrast. 75 mL of iohexol  (OMNIPAQUE ) 350 MG/ML injection was administered. Multiplanar 2D and/or 3D reformatted images are provided for review. Automated exposure control, iterative reconstruction, and/or weight based adjustment of the mA/kV was utilized to reduce the radiation dose to as low as reasonably achievable. COMPARISON: None available CLINICAL HISTORY: Sudden onset headache worsening in intensity with intermittent vertigo. FINDINGS: CT HEAD: BRAIN AND VENTRICLES: There is no evidence of an acute infarct, intracranial hemorrhage, mass, midline shift, hydrocephalus, or extra-axial fluid  collection. Cerebral volume is normal. There is no abnormal enhancement. ORBITS: The visualized portion of the orbits demonstrate no acute abnormality. SINUSES: Small volume layering fluid/secretions in the right sphenoid sinus. Mild left ethmoid and left maxillary sinus mucosal thickening. SOFT TISSUES AND SKULL: No acute abnormality of the visualized skull or soft tissues. CTA HEAD: ANTERIOR CIRCULATION: The intracranial internal carotid arteries are patent with mild atherosclerosis not resulting in a significant stenosis. ACAs and MCAs are patent without evidence of a proximal branch lesion or significant proximal stenosis. No aneurysm. POSTERIOR CIRCULATION: The included distal V3 and V4 segments of both vertebral arteries are widely patent to the basilar with the right being minimally dominant. Patent PICA, AICA, and SCA origins are visualized bilaterally. The basilar artery is widely patent. Posterior communicating arteries are diminutive or absent. Both PCAs are patent without evidence of a significant proximal stenosis. No aneurysm. OTHER: No dural venous sinus thrombosis on this non-dedicated study. IMPRESSION: 1. No acute intracranial abnormality. 2. Minimal  intracranial atherosclerosis without a large vessel occlusion, significant stenosis, or aneurysm. Electronically signed by: Dasie Hamburg MD 08/05/2024 06:49 PM EST RP Workstation: [id number]   DG Chest 2 View Result Date: 08/05/2024 CLINICAL DATA:  Mid chest pain and headache or shortness of breath. EXAM: CHEST - 2 VIEW COMPARISON:  Nov 13, 2023 FINDINGS: The heart size and mediastinal contours are within normal limits. Both lungs are clear. The visualized skeletal structures are unremarkable. IMPRESSION: No active cardiopulmonary disease. Electronically Signed   By: Suzen Dials M.D.   On: 08/05/2024 18:09     Procedures   Medications Ordered in the ED  prochlorperazine  (COMPAZINE ) injection 10 mg (10 mg Intravenous Given 08/05/24 1932)   diphenhydrAMINE  (BENADRYL ) injection 25 mg (25 mg Intravenous Given 08/05/24 1930)  dexamethasone  (DECADRON ) injection 10 mg (10 mg Intravenous Given 08/05/24 1927)  iohexol  (OMNIPAQUE ) 350 MG/ML injection 75 mL (75 mLs Intravenous Contrast Given 08/05/24 1821)  ketorolac  (TORADOL ) 15 MG/ML injection 15 mg (15 mg Intravenous Given 08/05/24 1939)                                    Medical Decision Making This patient presents to the ED for concern of multiple complaints, this involves an extensive number of treatment options, and is a complaint that carries with it a high risk of complications and morbidity.  The differential diagnosis includes tension headache, migraine headache, cluster headache, intracranial hemorrhage, intracranial mass, stroke versus TIA, ACS, urinary tract infection, constipation   Co morbidities that complicate the patient evaluation  Hypertension   Additional history obtained:  Additional history obtained from record review External records from outside source obtained and reviewed including prior PCP note   Lab Tests:  I Ordered, and personally interpreted labs.  The pertinent results include: CBC within normal limits.  BMP within normal limits.  D-dimer normal, 0.43.  Initial troponin < 6, repeat remains unchanged.  Lipase normal, 32.  Urinalysis with rare bacteria, negative leukocytes/nitrites so low suspicion for urinary tract infection, will send for culture.   Imaging Studies ordered:  I ordered imaging studies including CXR, CT head, CTA head   I independently visualized and interpreted imaging which showed  - CXR: No active cardiopulmonary disease. - CTA head: 1. No acute intracranial abnormality. 2. Minimal intracranial atherosclerosis without a large vessel occlusion, significant stenosis, or aneurysm.  I agree with the radiologist interpretation   Cardiac Monitoring: / EKG:  The patient was maintained on a cardiac monitor.  I personally viewed and  interpreted the cardiac monitored which showed an underlying rhythm of: NSR   Problem List / ED Course / Critical interventions / Medication management  I ordered medication including Compazine /Benadryl /Decadron /Toradol  for headache Reevaluation of the patient after these medicines showed that the patient resolved I have reviewed the patients home medicines and have made adjustments as needed   Test / Admission - Considered:  Physical exam is largely unremarkable as above, no focal neurologic deficits on exam.  Patient complaining of excruciating onset of headache symptoms that have been growing in intensity since the onset of her headache 2 hours ago, given patient's age and infrequency of headaches typically I do feel that further imaging is warranted. Patient also complaining of some chest discomfort that coincided with the headache, although patient notes that this is minor in comparison.  Will proceed with ACS workup as well. CTA is largely unremarkable as above, does note some  mild atherosclerotic changes but no acute intracranial abnormalities.  Will reassess patient's condition after migraine cocktail. Patient notes complete resolution of headache symptoms after migraine cocktail, patient is feeling well and safe for discharge. As above, workup is largely unremarkable.  Troponin normal x 2, EKG without acute ischemic changes, D-dimer normal.  Discussed results of CTA with the patient, advised that typically these atherosclerotic changes are associated with conditions like high cholesterol, and recommend that she discuss this with her primary care provider at her next follow-up.  Strict return precautions discussed, patient voiced understanding and is in agreement this plan, she is appropriate for discharge at this time.   Staffed with Dr. Ginger  Amount and/or Complexity of Data Reviewed Labs: ordered. Radiology: ordered.  Risk Prescription drug management.        Final  diagnoses:  Acute nonintractable headache, unspecified headache type  Chest pain, unspecified type    ED Discharge Orders     None          Glendia Rocky LOISE DEVONNA 08/05/24 2105    Tegeler, Lonni PARAS, MD 08/05/24 2143  "

## 2024-08-06 LAB — URINE CULTURE: Culture: <10000 — AB
